# Patient Record
Sex: Female | Born: 1965 | Race: White | Hispanic: No | Marital: Married | Smoking: Never smoker
Health system: Southern US, Community
[De-identification: ages and names within clinical notes are randomized; demographics above are authoritative.]

## PROBLEM LIST (undated history)

## (undated) DIAGNOSIS — E785 Hyperlipidemia, unspecified: Secondary | ICD-10-CM

## (undated) DIAGNOSIS — E079 Disorder of thyroid, unspecified: Secondary | ICD-10-CM

## (undated) HISTORY — PX: REDUCTION MAMMAPLASTY: SUR839

## (undated) HISTORY — PX: OTHER SURGICAL HISTORY: SHX169

## (undated) HISTORY — PX: BREAST SURGERY: SHX581

## (undated) HISTORY — PX: HERNIA REPAIR: SHX51

## (undated) HISTORY — PX: LAPAROSCOPIC OOPHERECTOMY: SHX6507

## (undated) HISTORY — PX: ABDOMINAL HYSTERECTOMY: SHX81

## (undated) HISTORY — DX: Hyperlipidemia, unspecified: E78.5

## (undated) HISTORY — DX: Disorder of thyroid, unspecified: E07.9

---

## 2001-04-08 ENCOUNTER — Other Ambulatory Visit: Admission: RE | Admit: 2001-04-08 | Discharge: 2001-04-08 | Payer: Self-pay | Admitting: Family Medicine

## 2001-04-18 ENCOUNTER — Encounter: Admission: RE | Admit: 2001-04-18 | Discharge: 2001-04-18 | Payer: Self-pay | Admitting: Family Medicine

## 2001-04-18 ENCOUNTER — Encounter: Payer: Self-pay | Admitting: Family Medicine

## 2002-05-27 ENCOUNTER — Other Ambulatory Visit: Admission: RE | Admit: 2002-05-27 | Discharge: 2002-05-27 | Payer: Self-pay | Admitting: Family Medicine

## 2004-04-06 ENCOUNTER — Other Ambulatory Visit: Admission: RE | Admit: 2004-04-06 | Discharge: 2004-04-06 | Payer: Self-pay | Admitting: Obstetrics and Gynecology

## 2004-05-02 ENCOUNTER — Encounter (INDEPENDENT_AMBULATORY_CARE_PROVIDER_SITE_OTHER): Payer: Self-pay | Admitting: *Deleted

## 2004-05-02 ENCOUNTER — Inpatient Hospital Stay (HOSPITAL_COMMUNITY): Admission: RE | Admit: 2004-05-02 | Discharge: 2004-05-05 | Payer: Self-pay | Admitting: Obstetrics and Gynecology

## 2007-11-18 ENCOUNTER — Ambulatory Visit (HOSPITAL_COMMUNITY): Admission: RE | Admit: 2007-11-18 | Discharge: 2007-11-18 | Payer: Self-pay | Admitting: General Surgery

## 2007-11-28 ENCOUNTER — Encounter: Admission: RE | Admit: 2007-11-28 | Discharge: 2007-11-28 | Payer: Self-pay | Admitting: General Surgery

## 2008-03-09 ENCOUNTER — Encounter: Admission: RE | Admit: 2008-03-09 | Discharge: 2008-03-09 | Payer: Self-pay | Admitting: General Surgery

## 2008-03-23 ENCOUNTER — Ambulatory Visit (HOSPITAL_COMMUNITY): Admission: RE | Admit: 2008-03-23 | Discharge: 2008-03-24 | Payer: Self-pay | Admitting: General Surgery

## 2008-12-21 ENCOUNTER — Emergency Department (HOSPITAL_COMMUNITY): Admission: EM | Admit: 2008-12-21 | Discharge: 2008-12-21 | Payer: Self-pay | Admitting: Family Medicine

## 2010-01-02 ENCOUNTER — Encounter: Admission: RE | Admit: 2010-01-02 | Discharge: 2010-01-02 | Payer: Self-pay | Admitting: Family Medicine

## 2010-04-05 ENCOUNTER — Encounter: Admission: RE | Admit: 2010-04-05 | Discharge: 2010-04-05 | Payer: Self-pay | Admitting: General Surgery

## 2011-03-06 NOTE — Op Note (Signed)
NAME:  Connie [name], Connie [name]                  ACCOUNT NO.:  [id number]   MEDICAL RECORD NO.:  [id number]          PATIENT TYPE:  AMB   LOCATION:  DAY                          FACILITY:  [facility]   PHYSICIAN:  Sharlet Salina T. Hoxworth, M.D.DATE OF BIRTH:  December 30, 1965   DATE OF PROCEDURE:  03/23/2008  DATE OF DISCHARGE:                               OPERATIVE REPORT   PREOPERATIVE DIAGNOSES:  1. Morbid obesity.  2. Hiatal hernia.  3. Ventral incisional hernia.   POSTOPERATIVE DIAGNOSES:  1. Morbid obesity.  2. Hiatal hernia.  3. Ventral incisional hernia.   SURGICAL PROCEDURES:  1. Laparoscopic adjustable gastric band.  2. Hiatal hernia repair.  3. Laparoscopic ventral hernia repair with mesh.   SURGEON:  Lorne Skeens. Hoxworth, MD   ASSISTANT:  Thornton Park. Daphine Deutscher, MD   ANESTHESIA:  General.   BRIEF HISTORY:  Connie [name] is a 45 year old female with a long history  of progressive morbid obesity unresponsive to medical management.  After  an extensive preop discussion and workup detailed elsewhere, we have  elected proceed with placement of a laparoscopic adjustable gastric band  for treatment of her morbid obesity.  In addition, she has had a lower  abdominal wall reconstruction and hysterectomy and has a periumbilical  very symptomatic incisional hernia several centimeters in diameter.  We  plan to fix this at the same setting.  The patient has also been noted  to have a small hiatal hernia on her upper GI series.  We have discussed  that this will be repaired as well so that it does not interfere with  her lap band function.   DESCRIPTION OF OPERATION:  The patient was brought to the operating room  and placed in the supine position on the operating table and general  orotracheal anesthesia was induced.  The abdomen was widely sterilely  prepped and draped.  She received preoperative IV antibiotics.  Heparin  5000 units had been administered subcutaneously preoperatively.  The  abdomen was  widely sterilely prepped and draped.  Correct patient and  procedures were verified.  Access was obtained in the left subcostal  space with an 11-mm OptiView trocar without difficulty and  pneumoperitoneum established.  The stomach was quite distended but this  was easily decompressed with an orogastric tube.  Under direct vision a  15-mm trocar was placed in the right upper lateral abdomen just under  the falciform ligament and an 11-mm trocar in the right midabdomen,  staying as much away from her periumbilical incisional hernia as  possible.  Under direct vision a 5-mm trocar was placed subxiphoid and  then replaced with the [facility] retractor, the left lobe of the liver  elevated with excellent exposure of the hiatus of the stomach.  An 11-mm  camera was placed to the left of the umbilicus for the camera port and  finally a 5-mm trocar placed in the left flank.  The stomach and hiatus  were exposed.  There did appear to be a small hiatal hernia.  This was  confirmed by inserting the sizing tube, inflating the balloon to 15  mL  and this pulled back through the hiatal opening.  The balloon was  deflated and the sizing tube brought back into the upper esophagus.  Initially peritoneum overlying the left crus was incised with the hook  cautery and dissection carried bluntly down along the left crus toward  the retrogastric space, leaving lateral attachments on the angle of His.  Following this the gastrocolic omentum was then opened in an avascular  space and this dissection was carried up over the hiatus in preparation  for the hiatal hernia repair.  The anterior border of the right crus was  dissected just posterior to the esophagus and the retroesophageal space  entered.  There was a definite hiatal hernia and the esophageal fat pad  was brought down from the hiatal hernia and completely reduced and  excised with cautery.  The right and left crura were carefully bluntly  dissected and  clearly defined down to their base.  Following this the  hiatus was closed beginning at the base of the crura with two  interrupted 0 Ethibond sutures, closing this back to a normal size.  At  this point the peritoneum anterior to the right crus was incised more  inferiorly at the area of crossing fat and the finger dissector was able  to be passed with careful blunt dissection easily, retrogastrically  deployed up to the previously-dissected area of the angle of His.  A  flushed AP Standard lap band system was introduced, the tubing passed  with the finger dissector and then brought back behind the stomach and  the band brought back around the stomach without difficulty.  The sizing  tube was reintroduced through the stomach and then the band was locked  into place without any undue tension and the sizing tube removed.  Holding the tubing toward the patient's feet, the small gastric pouch  was defined and the fundus was imbricated up over the band to the small  gastric pouch with three interrupted 2-0 Ethibond sutures.  The band was  in very good position.  There was no bleeding.  Attention was then  turned to the ventral hernia.  The [facility] liver retractor was placed  and the patient was placed back in a neutral position.  The hernia  defect was further defined taking down preperitoneal fat and the most  inferior part of the falciform ligament to clear the anterior abdominal  wall that was about a 2-3-cm fascial defect.  A 10 cm diameter piece of  Proceed dual-sided mesh was fashioned and four stay sutures placed at  12, 3, 6 and 9 o'clock.  The mesh was then introduced into the abdominal  cavity and unfurled.  Two additional 5-mm trocars were placed in the  right lower quadrant and right lateral abdomen for the hernia repair and  inflating the mesh.  The mesh was unfurled and oriented.  Four small  stab wounds had been placed in the anterior abdominal wall corresponding  to the  sutures of previously and the suture grasper was used through  these to retrieve the sutures and the mesh was brought to the anterior  abdominal wall with excellent wide deployment and coverage of the defect  3-4 cm at least in all directions.  These sutures were secured and then  the mesh was tacked circumferentially with some additional central tacks  placed as well.  Following this abdomen was carefully inspected for any  evidence of bleeding or trocar injury and none was seen.  The  tubing was  brought out through the right mid abdominal port, all CO2 evacuated and  trocars removed.  The incision was lengthened somewhat, the anterior  fascia exposed and four 2-0 Prolene stay sutures were placed.  The  tubing was cut and attached to the port, which was then sutured to the  anterior abdominal wall with the previously-placed sutures.  This  incision was closed with subcutaneous running 3-0 Vicryl and skin  incisions were closed with subcuticular 4-0 Monocryl and Dermabond.  Sponge, needle and instrument counts correct.  The patient was taken to  the recovery room in good condition.      Lorne Skeens. Hoxworth, M.D.  Electronically Signed     BTH/MEDQ  D:  03/23/2008  T:  03/23/2008  Job:  [id number]

## 2011-03-09 ENCOUNTER — Encounter (INDEPENDENT_AMBULATORY_CARE_PROVIDER_SITE_OTHER): Payer: Self-pay | Admitting: General Surgery

## 2011-03-09 NOTE — H&P (Signed)
NAME:  Ashbaugh, Morine D                            ACCOUNT NO.:  [id number]   MEDICAL RECORD NO.:  [id number]                   PATIENT TYPE:  INP   LOCATION:  NA                                   FACILITY:  [facility]   PHYSICIAN:  Hal Morales, M.D.             DATE OF BIRTH:  1966/03/01   DATE OF ADMISSION:  DATE OF DISCHARGE:                                HISTORY & PHYSICAL   HISTORY OF PRESENT ILLNESS:  Ms. Lhommedieu is a 45 year old married white  female, status post bilateral tubal ligation, para 2-0-0-2, who presents for  a total abdominal hysterectomy because of abnormal uterine bleeding.  For  the past two years the patient's menstrual periods have increased in  duration from  three to seven days with lighter bleeding occurring during  ovulation.  The patient's flow is described as heavy with clots requiring  a change of a maxi pad along with a Super Plus tampon every 1 to 1-1/2  hours.  The spotting the patient experiences during ovulation lasts for five  days and requires a change of a pad every four hours.  Occasionally the  patient experiences cramping which she rates as 5/10 on a 10 point scale;  however, this is quickly relieved with over the counter analgesia.  In  January of 2005 a pelvic ultrasound showed a large lobular uterus, measuring  12.8 x 5.2 x 6.1 cm with an anterior fundal fibroid measuring 3 cm x 2.7 cm  and normal-appearing ovaries bilaterally.  An endometrial biopsy in June of  2005 showed benign secretory endometrium with no hyperplasia or malignancy.  Additional tests and evaluation in March and April of 2005 revealed a normal  CA-125 and an endocrine consult which confirmed that the patient's  hypothyroid condition was responding appropriately to her 88 mcg dosing of  Synthroid.  A review of management options for fibroids along with the risks  and benefits of each included:  Birth control pills (the patient previously  had poor results from this),  Depo-Provera (the patient had concerns about  weight gain), Lupron Depo, uterine artery embolization, myomectomy, and  hysterectomy.  After careful consideration of each of these options, the  patient has chosen definitive therapy in the form of hysterectomy.   PAST MEDICAL HISTORY/OB HISTORY:  Gravida 2, para 2-0-0-2; the patient had a  cesarean section in 86 and 1992.  She experienced hyperemesis during her  pregnancies.   GYN HISTORY:  Menarche, 45 years old.  Last menstrual period was April 08, 2004.  She uses bilateral tubal ligation as her method of contraception,  denies any history of sexually transmitted diseases or abnormal Pap smears.  Her last normal Pap smear was June 2005.   PAST MEDICAL HISTORY:  1. Hypothyroidism.  2. Attention deficit disorder.  3. Insulin resistant.   PAST SURGICAL HISTORY:  1. 1987, cesarean section.  2. 1992, cesarean section.  3. 1993, abdominoplasty.  4. 1994, breast augmentation, denies any problems with anesthesia or history     of blood transfusions.   FAMILY HISTORY:  Positive for ovarian cancer, lung cancer, and diabetes  mellitus.   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   HABITS:  The patient does not use tobacco.  She rarely consumes alcohol.   CURRENT MEDICATIONS:  1. Adderall 10 mg three times a day.  2. Synthroid 88 mcg one tablet daily.  3. Fiber tablets two tablets daily.   ALLERGIES:  1. The patient is allergic to 'PORK which causes her severe gastrointestinal     upset and causes her to become comatose.  2. TETANUS causes tissue necrosis.  3. DEMEROL - violent vomiting.   REVIEW OF SYSTEMS:  Positive for bloating, increased abdominal girth.  The  patient does wear glasses.  Urge/stress incontinence.   PHYSICAL EXAMINATION:  VITAL SIGNS:  Blood pressure 120/80, weight 193,  height 5 feet 0 inches tall.  NECK:  Supple.  There is no thyromegaly or cervical adenopathy.  HEART:   Regular rate and rhythm.  There is no murmur.  LUNGS:  Clear.  There are no wheezes, rales, or rhonchi.  BACK:  No CVA tenderness.  ABDOMEN:  Bowel sounds are present.  It is soft without tenderness,  guarding, rebound, or organomegaly.  EXTREMITIES:  No clubbing, cyanosis, or edema.  PELVIC:  EGBUS is within normal limits.  Vagina is rugous.  Cervix is  nontender without lesions.  Uterus appears 12-14 weeks' size, without  tenderness.  Adnexa without tenderness or masses.  Rectovaginal without  tenderness or masses.   IMPRESSION:  1. Menometrorrhagia.  2. Fibroid uterus.  3. Urge/stress incontinence.   DISPOSITION:  A discussion was held with the patient regarding the  indications for her procedure (as mentioned in history of present illness.  Other options were also discussed in detail), along with the risks which  include, but are not limited to, reaction to anesthesia, damage to adjacent  organs, infection, and excessive bleeding.  The patient further understands  that she will no longer be able bear children after this procedure.  At the  time of this dictation the patient is undergoing urologic evaluation for her  symptoms of urge/stress incontinence and those results are pending.  She was  further given a copy of the ACOG brochure entitled Understanding  Hysterectomy and Preparing for Surgery.  The patient is scheduled to  undergo a total abdominal hysterectomy at [facility] on  May 02, 2004 at 7:30 a.m.     Elmira J. Adline Peals.                    Hal Morales, M.D.    EJP/MEDQ  D:  04/29/2004  T:  04/29/2004  Job:  [id number]

## 2011-03-09 NOTE — Op Note (Signed)
NAME:  Connie [name], Connie [name]                            ACCOUNT NO.:  [id number]   MEDICAL RECORD NO.:  [id number]                   PATIENT TYPE:  INP   LOCATION:  9399                                 FACILITY:  [facility]   PHYSICIAN:  Hal Morales, M.[name].             DATE OF BIRTH:  12/15/1965   DATE OF PROCEDURE:  05/02/2004  DATE OF DISCHARGE:                                 OPERATIVE REPORT   PREOPERATIVE DIAGNOSES:  1. Menometrorrhagia.  2. Uterine fibroids.  3. Stress urinary incontinence.  4. Urgency incontinence.  5. Pigmented nevus of the abdomen.   POSTOPERATIVE DIAGNOSES:  1. Menometrorrhagia.  2. Uterine fibroids.  3. Stress urinary incontinence.  4. Urgency incontinence.  5. Pigmented nevus of the abdomen.   1. Peritoneal adhesive disease.   OPERATION:  Transobturator tension free vaginal tape sling, total abdominal  hysterectomy, lysis of adhesions, removal of abdominal nevus.   ANESTHESIA:  Spinal and epidural.   ESTIMATED BLOOD LOSS:  300 mL.   SURGEONS:  1. Sigmund I. Patsi Sears, M.[name]., for TVT.  2. Hal Morales, M.[name]., for total abdominal hysterectomy, lysis of     adhesions and removal of nevus.   FIRST ASSISTANTS:  1. Hal Morales, M.[name]., for TVT.  2. Osborn Coho, M.[name]., for total abdominal hysterectomy and lysis of     adhesions.   ESTIMATED BLOOD LOSS:  300 mL.   COMPLICATIONS:  None.   FINDINGS:  The uterus was upper limits of normal size and densely adherent  to the anterior abdominal wall.  The ovaries were normal bilaterally.  The  tubes were status post tubal interruption for sterilization.  There was a 1  x 1.5 cm pigmented nevus in the left lower quadrant near the left groin.   DESCRIPTION OF PROCEDURE:  The patient was taken to the operating room after  appropriate identification, placed on the operating room.  After the  attainment of adequate general anesthesia, she was placed in the lithotomy  position.  Sigmund I. Patsi Sears,  M.[name]., performed the transobturator TVT and  will dictate that under a separate operative report.  Once that had been  completed, the patient was placed in the supine position and the abdomen  prepped with  multiple layers of Betadine and draped as a sterile field.  A  Foley catheter had been left in place after the TVT and the vagina had been  prepped as part of the preparation for that procedure.  Subcutaneous  infiltration of 0.25% Marcaine underneath the pigmented nevus and along the  impended incision line was undertaken for a total of 20 mL.  The pigmented  nevus was sharply excised and the subcutaneous bleeders cauterized.  The  skin incision was closed with a subcuticular suture of 3-0 Monocryl and  Steri-Strips applied.  The suprapubic incision was made approximately 2  fingerbreadths above the symphysis pubis and the abdomen opened in layers.  The peritoneum was entered and the uterus noted to be densely adherent to  the anterior abdominal peritoneum.  A combination of sharp and blunt  dissection allowed the uterus to be freed from the anterior peritoneum and  once this had occurred, the self-retaining retractor could be placed.  The  uterus was then elevated into the operative field with Kelly clamps at  either fundal region.  The left round ligament was identified, suture  ligated and incised and that incision taken anteriorly on the anterior leaf  of the broad ligament.  The utero-ovarian ligament was isolated, clamped,  cut and tied with free tie and suture ligated.  A similar procedure was  carried out on the opposite side with the round ligament and the utero-  ovarian ligament.  The bladder was sharply and bluntly dissected off the  anterior cervix with a portion of this dissection  having been done with the  uterus was separated from the anterior peritoneum.  The uterine arteries on  the right and left side were skeletonized, clamped, cut, and suture ligated.  The  paracervical tissues were successively clamped, cut, and suture ligated  down to the level of the uterosacral ligaments on the right and left side.  These ligaments were then clamped, cut, and suture ligated and their sutures  held.  The vaginal angles were then clamped, cut, and suture ligated with  those sutures held and with complete closure of the vaginal cuff within  those two sutures.  Hemostatic sutures were then placed on the right and  left side between the uterosacral ligament sutures and the vaginal sutures  to allow for adequate hemostasis.  An additional hemostatic suture was  placed in the area of the right ovary to allow for good hemostasis in that  area.  Copious irrigation was carried out and the sutures holding the  vaginal angle and the uterosacral ligaments tied together on either side.  Once copious irrigation was again carried out and hemostasis noted to be  adequate, all instruments were removed from the peritoneal cavity.  The  abdominal peritoneum was then repaired with a running suture of 2-0 Vicryl.  The rectus fascia was closed with a running suture of 0 Vicryl from each  apex to the midline and tied in the midline.  The subcutaneous tissue was  copiously irrigated and made hemostatic with Bovie cautery.  A 7 mm Al Pimple drain was placed in the subcutaneous tissue through a stab wound in  the right lower quadrant.  This was tied in place with a suture of 2-0  Ethilon.  Skin staples were applied to the skin incision and sterile  dressing applied.  The grenade was attached to the Jackson-Pratt drain and a  sterile dressing applied to that as well.  The patient was awakened from  general anesthesia and taken to the recovery room in satisfactory condition  having tolerated the procedure well with sponge and instrument counts  correct.                                               Hal Morales, M.[name].   VPH/MEDQ  [name]:  05/02/2004  T:  05/02/2004  Job:   [id number]   cc:   Lynelle Smoke I. Patsi Sears, M.[name].  [address]  [place]  [place] [address]  Fax: [phone]

## 2011-03-09 NOTE — Discharge Summary (Signed)
NAME:  Connie [name], Connie [name]                            ACCOUNT NO.:  [id number]   MEDICAL RECORD NO.:  [id number]                   PATIENT TYPE:  INP   LOCATION:  9315                                 FACILITY:  [facility]   PHYSICIAN:  Connie [name], M.[name].             DATE OF BIRTH:  [birth year]   DATE OF ADMISSION:  05/02/2004  DATE OF DISCHARGE:  05/05/2004                                 DISCHARGE SUMMARY   DISCHARGE DIAGNOSES:  1. Fibroid uterus.  2. Menometrorrhagia.  3. Stress urinary incontinence.  4. Urge incontinence.  5. Adenomyosis.  6. Pelvic adhesions.  7. Endometrial polyp.  8. Seborrheic keratosis.   OPERATION:  On the date of admission the patient underwent a total abdominal  hysterectomy with lysis of adhesions, excision of left lower quadrant nevus,  and the placement of a transobturator transvaginal tape, tolerating all  procedures well.  The patient was found to have a uterus which was the upper  limits of normal size and adherent to the anterior abdominal wall with  normal-appearing ovaries bilaterally.  She was also observed to have a  pigmented nevus in her left lower quadrant measuring 1.0 x 1.5 cm.   HISTORY OF PRESENT ILLNESS:  Connie [name] is a 45 year old married white  female status post bilateral tubal ligation para 2-0-0-2 who presents for a  total abdominal hysterectomy because of abnormal uterine bleeding and for  excision of a left lower quadrant pigmented nevus.  During the patient's  preoperative examination it was found that she was experiencing symptoms of  stress and urge incontinence, underwent urologic evaluation, and was  consented to proceed with the placement of a transvaginal tape.  Please see  the patient's dictated History and Physical Examination for details.   PREOPERATIVE PHYSICAL EXAMINATION:  VITAL SIGNS:  Blood pressure 120/80,  weight is 193 pounds, height 5 feet 0 inches tall.  GENERAL:  Within normal limits.  PELVIC:  EG/BUS is  within normal limits.  The vagina is rugous.  Cervix is  nontender without lesions.  Uterus appears 12-14 weeks size without  tenderness.  Adnexa without tenderness or masses.  Rectovaginal without  tenderness or masses.   HOSPITAL COURSE:  On the date of admission the patient underwent  aforementioned procedures, tolerating them all well.  Postoperative course  was marked by a temperature spike of 102 degrees Fahrenheit orally on the  eve of the patient's day of surgery.  Her white blood count was found to be  within normal limits and the patient's fever responded appropriately to  antipyretics.  Postoperative hemoglobin was 9.9 (preoperative hemoglobin  12.5).  The patient continued to have fluctuations in her temperature  throughout her hospital stay.  However, blood cultures proved to be negative  and an abdominopelvic CT scan returned without any acute process being  identified.  By postoperative day #3 the patient had resumed bowel and  bladder function  and was therefore deemed ready for discharge home.  At the  time of the patient's discharge it was observed that she appeared to have  cellulitis around the site of her nevus excision and was therefore placed on  antibiotics.   DISCHARGE MEDICATIONS:  1. Augmentin 500 mg one tablet three times daily for 7 days.  2. Phenergan 25 mg one tablet q.6h. as needed for nausea.  3. Protonix 20 mg one tablet daily.  4. Tylox one to two tablets q.4-6h. as needed for pain.  5. Ibuprofen 600 mg one tablet q.6h. for 3 days then as needed for pain.   FOLLOW-UP:  The patient is scheduled for staple removal along with removal  of her JP drain at [facility] on May 09, 2004 at 2:30 p.m.  At  that time she will be scheduled for a 6 weeks postoperative visit with Dr.  Pennie [name].   DISCHARGE INSTRUCTIONS:  The patient was given a copy of [facility] postoperative instruction sheet.  She was further advised to call the   physician for any increase in pain, increased bleeding, temperature greater  than 100.4, and to maintain a clean and dry incision.  The patient's diet  was without restriction.   FINAL PATHOLOGY:  1. Skin excisional biopsy:  Seborrheic keratosis.  2. Uterus and cervix, hysterectomy:  Cervix:  Cervicitis and squamous     metaplasia.  Endometrium:  Proliferative endometrium, endometrial polyp     with focal complex hyperplasia without atypia, no carcinoma identified.     Myometrium:  Adenomyosis.  Serosa:  Adhesions and endosalpingiosis.     Connie [name].                    Connie [name], M.[name].    EJP/MEDQ  [name]:  05/30/2004  T:  05/30/2004  Job:  [id number]

## 2011-03-09 NOTE — Op Note (Signed)
NAME:  [name], Connie D                            ACCOUNT NO.:  [id number]   MEDICAL RECORD NO.:  [id number]                   PATIENT TYPE:  INP   LOCATION:  9399                                 FACILITY:  [facility]   PHYSICIAN:  Sigmund I. Patsi Sears, M.D.         DATE OF BIRTH:  [birth year]   DATE OF PROCEDURE:  05/02/2004  DATE OF DISCHARGE:                                 OPERATIVE REPORT   PREOPERATIVE DIAGNOSIS:  Stress urinary incontinence.   POSTOPERATIVE DIAGNOSIS:  Stress urinary incontinence.   OPERATION:  Transobturator pubovaginal sling (Mentor OB tape).   PREPARATION:  After appropriate preanesthesia, the patient is brought to the  operating room and placed on the operating table in dorsal supine position  where a spinal anesthetic was placed.  She was then replaced in the dorsal  lithotomy position where the pubis is prepped with Betadine solution and  draped in usual fashion.   DESCRIPTION OF PROCEDURE:  A Foley catheter was placed, and posterior  weighted speculum was placed.  There were 8 mL of Marcaine 0.25 plain  injected into the periurethral space, and a 2 cm midline incision is made  and subcutaneous tissue is dissected lateralward to the retropubic space.  Finger dissection and blunt dissection was also accomplished.  Two separate  stab wounds were made 5 cm lateral to the clitoris, and following stab  wound, the flap Mentor transobturator instrument is placed, and tape placed  in standard fashion.  Midline is identified, and the smooth flat portion of  the sling was placed where the midline mark is noted in correct position.  Cystoscopy revealed no evidence of any bladder trauma.  The bladder was  drained of fluid.  Foley catheter placed, and with a right angle clamp  behind the sling for tensioning, the transobturator tape was cut at the  level of the insertion sites.  Right angle was removed, and the urethral  wound closed in two layers with 3-0 Vicryl suture.   The patient tolerated  the procedure well.  Foley catheter was replaced, the patient underwent  hysterectomy per Dr. Pennie Rushing.                                               Sigmund I. Patsi Sears, M.D.    SIT/MEDQ  D:  05/02/2004  T:  05/02/2004  Job:  [id number]

## 2011-07-19 LAB — DIFFERENTIAL
Basophils Relative: 0
Eosinophils Absolute: 0
Lymphs Abs: 1.3
Monocytes Relative: 6
Neutro Abs: 12.1 — ABNORMAL HIGH
Neutrophils Relative %: 85 — ABNORMAL HIGH

## 2011-07-19 LAB — HEMOGLOBIN AND HEMATOCRIT, BLOOD: Hemoglobin: 13.3

## 2011-07-19 LAB — CBC
MCHC: 34.4
MCV: 89.4
Platelets: 194
RBC: 4.03
WBC: 14.2 — ABNORMAL HIGH

## 2011-12-17 ENCOUNTER — Emergency Department (HOSPITAL_COMMUNITY)
Admission: EM | Admit: 2011-12-17 | Discharge: 2011-12-18 | Disposition: A | Discharge status: Home / Self Care | Payer: 59 | Attending: Emergency Medicine | Admitting: Emergency Medicine

## 2011-12-17 ENCOUNTER — Other Ambulatory Visit: Payer: Self-pay

## 2011-12-17 ENCOUNTER — Encounter (HOSPITAL_COMMUNITY): Payer: Self-pay | Admitting: *Deleted

## 2011-12-17 DIAGNOSIS — E785 Hyperlipidemia, unspecified: Secondary | ICD-10-CM | POA: Insufficient documentation

## 2011-12-17 DIAGNOSIS — E079 Disorder of thyroid, unspecified: Secondary | ICD-10-CM | POA: Insufficient documentation

## 2011-12-17 DIAGNOSIS — M542 Cervicalgia: Secondary | ICD-10-CM

## 2011-12-17 DIAGNOSIS — Z79899 Other long term (current) drug therapy: Secondary | ICD-10-CM | POA: Insufficient documentation

## 2011-12-17 LAB — COMPREHENSIVE METABOLIC PANEL
ALT: 12 U/L (ref 0–35)
AST: 11 U/L (ref 0–37)
Albumin: 4.2 g/dL (ref 3.5–5.2)
Alkaline Phosphatase: 38 U/L — ABNORMAL LOW (ref 39–117)
Chloride: 107 mEq/L (ref 96–112)
Potassium: 4 mEq/L (ref 3.5–5.1)
Sodium: 139 mEq/L (ref 135–145)
Total Protein: 7.1 g/dL (ref 6.0–8.3)

## 2011-12-17 LAB — POCT I-STAT TROPONIN I: Troponin i, poc: 0 ng/mL (ref 0.00–0.08)

## 2011-12-17 LAB — CK TOTAL AND CKMB (NOT AT ARMC)
CK, MB: 1.6 ng/mL (ref 0.3–4.0)
Relative Index: INVALID (ref 0.0–2.5)
Total CK: 68 U/L (ref 7–177)

## 2011-12-17 LAB — DIFFERENTIAL
Lymphocytes Relative: 32 % (ref 12–46)
Lymphs Abs: 3.3 10*3/uL (ref 0.7–4.0)
Monocytes Relative: 6 % (ref 3–12)
Neutrophils Relative %: 60 % (ref 43–77)

## 2011-12-17 LAB — CBC
Hemoglobin: 12.3 g/dL (ref 12.0–15.0)
Platelets: 263 10*3/uL (ref 150–400)
RBC: 4.07 MIL/uL (ref 3.87–5.11)
WBC: 10.4 10*3/uL (ref 4.0–10.5)

## 2011-12-17 MED ORDER — OXYCODONE-ACETAMINOPHEN 5-325 MG PO TABS
1.0000 | ORAL_TABLET | Freq: Once | ORAL | Status: AC
  Administered 2011-12-17: 1 via ORAL
  Filled 2011-12-17: qty 1

## 2011-12-17 NOTE — ED Notes (Signed)
The pt has mild pain only at present. More pain with movement of her head to the rt side.  She took 324mg  of aspirin at approx 1800

## 2011-12-17 NOTE — ED Notes (Signed)
Pt states 7/10 intermittent L sided neck pain, pain becomes worse with movement. Pt denies recent injury to area. States pain radiates down to middle back. She is conscious alert and oriented x4. No signs of distress noted at the time. Husband at bedside.

## 2011-12-17 NOTE — ED Provider Notes (Signed)
Pt seen with NP Manus Rudd She is here for neck pain, seems worse with movement I doubt ACS after talking to patient She is well appearing, Neck pain worse with movement No focal neuro deficits noted EKG reviewed  Joya Gaskins, MD 12/17/11 2318
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
## 2011-12-17 NOTE — ED Notes (Signed)
The pt is c/o neck pain since earlier today with lt lateral neck pain that radiates down to the middle of her back with sl nausea.

## 2011-12-18 MED ORDER — OXYCODONE-ACETAMINOPHEN 5-325 MG PO TABS: 1.0000 | ORAL_TABLET | Freq: Four times a day (QID) | ORAL | Status: AC | PRN

## 2011-12-18 NOTE — Discharge Instructions (Signed)
The exact cause of your neck pain tonight has not been definitively agonist, your EKG, cardiac markers electrolytes, CBC are all normal even given a prescription for Percocet for pain control.  Please make an appointment with your primary care physician for followup if he develops shortness of breath, diaphoresis, continued, persistent nausea, dizziness, chest pain please return for further evaluation

## 2011-12-18 NOTE — ED Notes (Signed)
Pt discharged home. Had no further questions. Encouraged to follow up with PCP. 

## 2011-12-19 NOTE — ED Provider Notes (Signed)
Medical screening examination/treatment/procedure(s) were conducted as a shared visit with non-physician practitioner(s) and myself.  I personally evaluated the patient during the encounter  Pt seen/examined, appears to be musculoskeletal in nature Doubt ACS at this time   Joya Gaskins, MD 12/19/11 [phone]

## 2011-12-25 ENCOUNTER — Other Ambulatory Visit (HOSPITAL_COMMUNITY): Payer: Self-pay | Admitting: Physician Assistant

## 2011-12-25 DIAGNOSIS — M542 Cervicalgia: Secondary | ICD-10-CM

## 2011-12-27 ENCOUNTER — Other Ambulatory Visit (HOSPITAL_COMMUNITY): Payer: 59

## 2011-12-31 ENCOUNTER — Ambulatory Visit (HOSPITAL_COMMUNITY)
Admission: RE | Admit: 2011-12-31 | Discharge: 2011-12-31 | Disposition: A | Discharge status: Home / Self Care | Payer: 59 | Source: Ambulatory Visit | Attending: Physician Assistant | Admitting: Physician Assistant

## 2011-12-31 DIAGNOSIS — R220 Localized swelling, mass and lump, head: Secondary | ICD-10-CM

## 2011-12-31 DIAGNOSIS — R221 Localized swelling, mass and lump, neck: Secondary | ICD-10-CM

## 2011-12-31 DIAGNOSIS — M542 Cervicalgia: Secondary | ICD-10-CM

## 2011-12-31 DIAGNOSIS — I6529 Occlusion and stenosis of unspecified carotid artery: Secondary | ICD-10-CM | POA: Insufficient documentation

## 2011-12-31 NOTE — Progress Notes (Signed)
*  PRELIMINARY RESULTS* Vascular Ultrasound Carotid Duplex (Doppler) has been completed.  Preliminary findings: Bilaterally no significant ICA stenosis with antegrade vertebral flow.  Farrel Demark RDMS 12/31/2011, 10:37 AM

## 2012-10-27 ENCOUNTER — Telehealth (INDEPENDENT_AMBULATORY_CARE_PROVIDER_SITE_OTHER): Payer: Self-pay | Admitting: General Surgery

## 2012-10-27 NOTE — Telephone Encounter (Signed)
10/27/12 mailed bariatric surgery recall letter to patient. Advised patient to call [phone] to schedule an appointment for follow-up. (lss)

## 2013-01-05 ENCOUNTER — Telehealth (INDEPENDENT_AMBULATORY_CARE_PROVIDER_SITE_OTHER): Payer: Self-pay | Admitting: *Deleted

## 2013-01-05 NOTE — Telephone Encounter (Signed)
Patient called in to state that she has been having some "gurgling" following eating.  Patient also states that for about the last 3 weeks she is becoming nauseated and also having some vomiting.  Patient states she is unable to come in to see Mardelle Matte PA this week due to her daughters wedding but is scheduled for lap band clinic 01/15/13.  Encouraged patient to come in earlier but states she can't due to daughters wedding.

## 2013-01-15 ENCOUNTER — Ambulatory Visit (INDEPENDENT_AMBULATORY_CARE_PROVIDER_SITE_OTHER): Payer: 59 | Admitting: Physician Assistant

## 2013-01-15 ENCOUNTER — Encounter (INDEPENDENT_AMBULATORY_CARE_PROVIDER_SITE_OTHER): Payer: Self-pay

## 2013-01-15 VITALS — BP 126/80 | HR 72 | Temp 97.4°F | Resp 16 | Ht 60.0 in | Wt 171.0 lb

## 2013-01-15 DIAGNOSIS — Z4651 Encounter for fitting and adjustment of gastric lap band: Secondary | ICD-10-CM

## 2013-01-15 NOTE — Progress Notes (Signed)
  HISTORY: Connie [name] is a 47 y.o.female who received an AP-Standard lap-band in June 2009 by Dr. Johna Sheriff. She arrives complaining of 6-8 weeks of increasing feeling of an 'air bubble' in her stomach which is associated with difficulty swallowing and regurgitation. She tends to bring up clear frothy liquid as a result. She's also complaining of a bulging to her left flank which she says has been getting progressively larger. She doesn't complain of pain there however. She says co-workers and her husband have both noticed this. She denies difficulty with passing flatus or BM.  VITAL SIGNS: Filed Vitals:   01/15/13 1134  BP: 126/80  Pulse: 72  Temp: 97.4 F (36.3 C)  Resp: 16    PHYSICAL EXAM: Physical exam reveals a very well-appearing 47 y.o.female in no apparent distress Neurologic: Awake, alert, oriented Psych: Bright affect, conversant Respiratory: Breathing even and unlabored. No stridor or wheezing Abdomen: Soft, nontender, nondistended to palpation. Incisions well-healed. No incisional hernias. Port easily palpated. The area that she indicates to her left flank is devoid of obvious hernia. No tenderness. The area is soft to palpation. There are no incisions in the area. Extremities: Atraumatic, good range of motion.  ASSESMENT: 47 y.o.  female  s/p AP-Standard lap-band.   PLAN: The patient's port was accessed with a 20G Huber needle without difficulty. Clear fluid was aspirated and 0.5 mL saline was removed from the port to give a total predicted volume of 5 mL. The patient was able to swallow water without difficulty following the procedure and was instructed to take clear liquids for the next 24-48 hours and advance slowly as tolerated. If the upper GI doesn't give any clues about her left flank bulge, I'll likely order a CT scan.

## 2013-01-15 NOTE — Patient Instructions (Signed)
Obtain your x-ray. Return afterward. Return sooner if you still have issues swallowing despite having fluid removed today.

## 2013-01-26 ENCOUNTER — Other Ambulatory Visit: Payer: 59

## 2013-01-29 ENCOUNTER — Encounter (INDEPENDENT_AMBULATORY_CARE_PROVIDER_SITE_OTHER): Payer: 59

## 2013-02-04 ENCOUNTER — Ambulatory Visit
Admission: RE | Admit: 2013-02-04 | Discharge: 2013-02-04 | Disposition: A | Discharge status: Home / Self Care | Payer: 59 | Source: Ambulatory Visit | Attending: Physician Assistant | Admitting: Physician Assistant

## 2013-02-04 DIAGNOSIS — Z4651 Encounter for fitting and adjustment of gastric lap band: Secondary | ICD-10-CM

## 2013-02-05 ENCOUNTER — Ambulatory Visit (INDEPENDENT_AMBULATORY_CARE_PROVIDER_SITE_OTHER): Payer: 59 | Admitting: Physician Assistant

## 2013-02-05 ENCOUNTER — Encounter (INDEPENDENT_AMBULATORY_CARE_PROVIDER_SITE_OTHER): Payer: Self-pay

## 2013-02-05 VITALS — BP 118/74 | HR 68 | Temp 97.4°F | Resp 12 | Ht 60.0 in | Wt 174.6 lb

## 2013-02-05 DIAGNOSIS — R1900 Intra-abdominal and pelvic swelling, mass and lump, unspecified site: Secondary | ICD-10-CM

## 2013-02-05 DIAGNOSIS — Z4651 Encounter for fitting and adjustment of gastric lap band: Secondary | ICD-10-CM

## 2013-02-05 NOTE — Patient Instructions (Signed)
Take clear liquids tonight. Thin protein shakes are ok to start tomorrow morning. Slowly advance your diet thereafter. Call us if you have persistent vomiting or regurgitation, night cough or reflux symptoms. Return as scheduled or sooner if you notice no changes in hunger/portion sizes. Obtain your CT scan.

## 2013-02-05 NOTE — Progress Notes (Signed)
  HISTORY: Connie [name] is a 47 y.o.female who received an AP-Standard lap-band in June 2009 by Dr. Johna Sheriff. She had an upper GI which showed the band in good position and contrast passed through without difficulty. She says she has no further symptoms of a 'bubble' when she swallows but she's hungry and eating more. She still has complaints of swelling to her left upper abdominal wall. She does have a history of ventral hernia repair with mesh.  VITAL SIGNS: Filed Vitals:   02/05/13 0855  BP: 118/74  Pulse: 68  Temp: 97.4 F (36.3 C)  Resp: 12    PHYSICAL EXAM: Physical exam reveals a very well-appearing 47 y.o.female in no apparent distress Neurologic: Awake, alert, oriented Psych: Bright affect, conversant Respiratory: Breathing even and unlabored. No stridor or wheezing Abdomen: Soft, nontender, nondistended to palpation. Incisions well-healed. No incisional hernias. Port easily palpated. Prominence of left upper abdominal wall with no appreciable mass or tenderness. Extremities: Atraumatic, good range of motion.  ASSESMENT: 47 y.o.  female  s/p AP-Standard lap-band.   PLAN: The patient's port was accessed with a 20G Huber needle without difficulty. Clear fluid was aspirated and 0.25 mL saline was added to the port to give a total predicted volume of 5.25 mL. The patient was able to swallow water without difficulty following the procedure and was instructed to take clear liquids for the next 24-48 hours and advance slowly as tolerated. As we discussed last time, I've ordered a CT A/P w/contrast to investigate the left upper abdominal swelling. We'll have her back in a month.

## 2013-02-12 ENCOUNTER — Other Ambulatory Visit: Payer: 59

## 2013-03-05 ENCOUNTER — Encounter (INDEPENDENT_AMBULATORY_CARE_PROVIDER_SITE_OTHER): Payer: 59

## 2015-10-11 ENCOUNTER — Other Ambulatory Visit: Payer: Self-pay | Admitting: Family Medicine

## 2015-10-11 DIAGNOSIS — R140 Abdominal distension (gaseous): Secondary | ICD-10-CM

## 2015-10-28 ENCOUNTER — Ambulatory Visit
Admission: RE | Admit: 2015-10-28 | Discharge: 2015-10-28 | Disposition: A | Discharge status: Home / Self Care | Payer: 59 | Source: Ambulatory Visit | Attending: Family Medicine | Admitting: Family Medicine

## 2015-10-28 ENCOUNTER — Ambulatory Visit
Admission: RE | Admit: 2015-10-28 | Discharge: 2015-10-28 | Disposition: A | Discharge status: Home / Self Care | Payer: Self-pay | Source: Ambulatory Visit | Attending: Family Medicine | Admitting: Family Medicine

## 2015-10-28 DIAGNOSIS — R140 Abdominal distension (gaseous): Secondary | ICD-10-CM

## 2016-04-20 DIAGNOSIS — J343 Hypertrophy of nasal turbinates: Secondary | ICD-10-CM | POA: Insufficient documentation

## 2016-04-20 DIAGNOSIS — J310 Chronic rhinitis: Secondary | ICD-10-CM | POA: Insufficient documentation

## 2016-04-20 DIAGNOSIS — J342 Deviated nasal septum: Secondary | ICD-10-CM | POA: Insufficient documentation

## 2016-04-20 DIAGNOSIS — H6983 Other specified disorders of Eustachian tube, bilateral: Secondary | ICD-10-CM | POA: Insufficient documentation

## 2016-10-17 ENCOUNTER — Encounter: Payer: Self-pay | Admitting: *Deleted

## 2016-10-24 ENCOUNTER — Encounter (HOSPITAL_COMMUNITY): Payer: Self-pay

## 2017-01-28 DIAGNOSIS — E785 Hyperlipidemia, unspecified: Secondary | ICD-10-CM | POA: Diagnosis not present

## 2017-01-28 DIAGNOSIS — Z9884 Bariatric surgery status: Secondary | ICD-10-CM | POA: Diagnosis not present

## 2017-01-28 DIAGNOSIS — E039 Hypothyroidism, unspecified: Secondary | ICD-10-CM | POA: Diagnosis not present

## 2017-01-28 DIAGNOSIS — E559 Vitamin D deficiency, unspecified: Secondary | ICD-10-CM | POA: Diagnosis not present

## 2017-03-25 DIAGNOSIS — E038 Other specified hypothyroidism: Secondary | ICD-10-CM | POA: Diagnosis not present

## 2017-04-23 DIAGNOSIS — H5203 Hypermetropia, bilateral: Secondary | ICD-10-CM | POA: Diagnosis not present

## 2017-07-30 ENCOUNTER — Other Ambulatory Visit: Payer: Self-pay | Admitting: Physician Assistant

## 2017-07-30 DIAGNOSIS — Z0000 Encounter for general adult medical examination without abnormal findings: Secondary | ICD-10-CM | POA: Diagnosis not present

## 2017-07-30 DIAGNOSIS — R140 Abdominal distension (gaseous): Secondary | ICD-10-CM

## 2017-07-30 DIAGNOSIS — R1032 Left lower quadrant pain: Secondary | ICD-10-CM

## 2017-07-30 DIAGNOSIS — E039 Hypothyroidism, unspecified: Secondary | ICD-10-CM | POA: Diagnosis not present

## 2017-07-30 DIAGNOSIS — E7800 Pure hypercholesterolemia, unspecified: Secondary | ICD-10-CM | POA: Diagnosis not present

## 2017-07-30 DIAGNOSIS — E559 Vitamin D deficiency, unspecified: Secondary | ICD-10-CM | POA: Diagnosis not present

## 2017-08-06 ENCOUNTER — Ambulatory Visit
Admission: RE | Admit: 2017-08-06 | Discharge: 2017-08-06 | Disposition: A | Discharge status: Home / Self Care | Payer: 59 | Source: Ambulatory Visit | Attending: Physician Assistant | Admitting: Physician Assistant

## 2017-08-06 DIAGNOSIS — R1032 Left lower quadrant pain: Secondary | ICD-10-CM

## 2017-08-06 DIAGNOSIS — R102 Pelvic and perineal pain: Secondary | ICD-10-CM | POA: Diagnosis not present

## 2017-08-06 DIAGNOSIS — R140 Abdominal distension (gaseous): Secondary | ICD-10-CM

## 2017-08-09 ENCOUNTER — Telehealth: Payer: Self-pay | Admitting: *Deleted

## 2017-08-09 DIAGNOSIS — Z9889 Other specified postprocedural states: Secondary | ICD-10-CM | POA: Diagnosis not present

## 2017-08-09 NOTE — Telephone Encounter (Signed)
Call[facility] and [facility] with Myrene at [facility]. Gave the information for the new patient appt on November 7th at 10:45am arrive at 10:30am. She will contact the patient.

## 2017-08-12 ENCOUNTER — Other Ambulatory Visit: Payer: Self-pay | Admitting: Physician Assistant

## 2017-08-12 DIAGNOSIS — Z1231 Encounter for screening mammogram for malignant neoplasm of breast: Secondary | ICD-10-CM

## 2017-08-13 ENCOUNTER — Telehealth: Payer: Self-pay

## 2017-08-13 NOTE — Telephone Encounter (Signed)
Spoke with patient concerning appoint date and time per orders 10/23 phone que

## 2017-08-15 ENCOUNTER — Encounter: Payer: Self-pay | Admitting: Gynecologic Oncology

## 2017-08-15 ENCOUNTER — Ambulatory Visit: Payer: 59 | Attending: Gynecologic Oncology | Admitting: Gynecologic Oncology

## 2017-08-15 VITALS — BP 118/79 | HR 66 | Temp 97.6°F | Resp 18 | Ht 60.0 in | Wt 175.1 lb

## 2017-08-15 DIAGNOSIS — E079 Disorder of thyroid, unspecified: Secondary | ICD-10-CM | POA: Diagnosis not present

## 2017-08-15 DIAGNOSIS — Z887 Allergy status to serum and vaccine status: Secondary | ICD-10-CM | POA: Insufficient documentation

## 2017-08-15 DIAGNOSIS — Z793 Long term (current) use of hormonal contraceptives: Secondary | ICD-10-CM | POA: Insufficient documentation

## 2017-08-15 DIAGNOSIS — Z885 Allergy status to narcotic agent status: Secondary | ICD-10-CM | POA: Insufficient documentation

## 2017-08-15 DIAGNOSIS — Z7982 Long term (current) use of aspirin: Secondary | ICD-10-CM | POA: Insufficient documentation

## 2017-08-15 DIAGNOSIS — N83292 Other ovarian cyst, left side: Secondary | ICD-10-CM | POA: Insufficient documentation

## 2017-08-15 DIAGNOSIS — Z91018 Allergy to other foods: Secondary | ICD-10-CM | POA: Insufficient documentation

## 2017-08-15 DIAGNOSIS — Z79899 Other long term (current) drug therapy: Secondary | ICD-10-CM | POA: Diagnosis not present

## 2017-08-15 DIAGNOSIS — E785 Hyperlipidemia, unspecified: Secondary | ICD-10-CM | POA: Diagnosis not present

## 2017-08-15 NOTE — Progress Notes (Signed)
Consult Note: Gyn-Onc  Consult was requested by Dr. Charlotta Newtonzan for the evaluation of Shamya D Rovner 51 y.o. female  CC:  Chief Complaint  Patient presents with  . Complex cyst of left ovary    Assessment/Plan:  Ms. Sherian Reinia D Trageser is a [age] y.o.  With a complex with a left complex adnexal masMeasuring 5.2 x 4.7 x 5.6 cm containing thick septations and a 9 mm solid nodule.  This is a new finding from the ultrasound in 2017.  CA-125 is within normal limits of 15.3.  Is counseled that the likelihood of malignancy is low.  The proposed procedure is a left oophorectomy and bilateral salpingectomy.  The patient strongly desires bilateral salpingo-oophorectomy so was counseled regarding the risks of cardiovascular disease osteoporosis and shorter life span associated with removal of ovaries prior to the age of 51.  She understands the information relayed and will consider this in her definitive decision making.  Ethlyn D Bozzo  was counseled that if malignancy is encountered the procedure will be extended to involve staging and debulking as indicated.  She is also aware that the area might be significant adhesive disease from the lap banding in 2009 and umbilical hernia repair in June 2009 the abdominal hysterectomy from July 2005 the abdominoplasty in June 1993 and the cesarean sections in 1987 in 1982.  The adhesive disease may compromise the ability to perform a minimally invasive procedure. The risk of the procedure discussed for infection bleeding damage to surrounding structures prolonged hospitalization and reoperation.  The date of the procedure is scheduled for September 04, 2017   .  HPI: Ms. Sherian Reinia D Heckert is a 51 y.o.  With a history is notable for total abdominal hysterectomy in 2005 for uterine leiomyoma.  In 2006 she began yearly monitoring for an ovarian cyst and the ultrasound in 2017 was within normal limits.  She reports that approximately 8 weeks ago she noticed some discomfort on the left lower quadrant  that was intermittent and sharp and spontaneously resolved.  An ultrasound obtained on July 30, 2017 was notable for an absent uterus and normal right ovary.  Left ovary is noted to measure 5.2 x 4.7 x 5.6 cm with a complex mass and a thick septation and a 9 mm solid nodule with vascular flow is appreciated.  There is no free pelvic fluid.  CA 125 obtained on July 30, 2017 returned a value of 15.3. Review of Systems:  Constitutional  Feels well,  Cardiovascular  No chest pain, shortness of breath, or edema  Pulmonary  No cough or wheeze.  Gastro Intestinal  No nausea, vomitting, or diarrhoea. No bright red blood per rectum, no abdominal pain, change in bowel movement, or constipation.  Genito Urinary  No frequency, urgency, dysuria Musculo Skeletal  No myalgia, arthralgia, joint swelling or pain  Neurologic  No weakness, numbness, change in gait,  Psychology  No depression, anxiety, insomnia.    Current Meds:  Outpatient Encounter Prescriptions as of 08/15/2017  Medication Sig  . amphetamine-dextroamphetamine (ADDERALL) 10 MG tablet TAKE 1 TABLET BY MOUTH EVERY MORNING  . aspirin 81 MG chewable tablet Chew 324 mg by mouth daily as needed. pain  . atorvastatin (LIPITOR) 80 MG tablet TAKE 1 TABLET BY MOUTH EVERY DAY  . Calcium Polycarbophil (FIBER-CAPS PO) Take 1 tablet by mouth daily.  Marland Kitchen. levothyroxine (SYNTHROID) 112 MCG tablet Take 112 mcg by mouth daily.    Marland Kitchen. loratadine (CLARITIN) 10 MG tablet Take 10 mg by mouth daily  as needed for allergies.  Marland Kitchen omeprazole (PRILOSEC) 20 MG capsule Take 20 mg by mouth daily as needed.  Marland Kitchen oxybutynin (DITROPAN) 5 MG tablet   . Vitamin D, Ergocalciferol, (DRISDOL) 50000 units CAPS capsule TAKE ONE CAPSULE BY MOUTH ONCE A WEEK   No facility-administered encounter medications on file as of 08/15/2017.     Allergy:  Allergies  Allergen Reactions  . Codeine Nausea And Vomiting  . Fentanyl Itching  . Tetanus-Diphth-Acell Pertussis [Diphth-Acell  Pertussis-Tetanus] Other (See Comments)    Arm turned black  . Demerol Itching and Nausea And Vomiting  . Pork-Derived Products Nausea And Vomiting    Social Hx:   Social History   Social History  . Marital status: Married    Spouse name: N/A  . Number of children: N/A  . Years of education: N/A   Occupational History  . Not on file.   Social History Main Topics  . Smoking status: Never Smoker  . Smokeless tobacco: Never Used  . Alcohol use Yes     Comment: Occasional drink about 1 time a month  . Drug use: No  . Sexual activity: Yes   Other Topics Concern  . Not on file   Social History Narrative  . No narrative on file    Past Surgical Hx:  Past Surgical History:  Procedure Laterality Date  . ABDOMINAL HYSTERECTOMY    . BREAST SURGERY     REDUCTION  . CESAREAN SECTION    . HERNIA REPAIR    . LAPBAND      Past Medical Hx:  Past Medical History:  Diagnosis Date  . Hyperlipidemia   . Thyroid disease     Past Gynecological History:  Gravida 2 para 2 hysterectomy in 2005 for leiomyoma and menorrhagia  Family Hx:  Family History  Problem Relation Age of Onset  . Hypertension Mother   . Ovarian cancer Paternal Grandmother     Vitals:  Blood pressure 118/79, pulse 66, temperature 97.6 F (36.4 C), temperature source Oral, resp. rate 18, height 5' (1.524 m), weight 175 lb 1.6 oz (79.4 kg), SpO2 100 %.  Physical Exam: WD in NAD Neck  Supple NROM, without any enlargements.  Lymph Node No cervical supraclavicular or inguinal adenopathy Cardiovascular  Pulse normal rate, regularity and rhythm. Lungs  Clear to auscultation bilaterally, without wheezes/crackles/rhonchi. Good air movement.  Skin  No rash/lesions/breakdown  Psychiatry  Alert and oriented appropriate mood affect speech and reasoning. Abdomen  Normoactive bowel sounds, abdomen soft, non-tender.  Numerous surgical  sites intact without evidence of hernia.  Back No CVA tenderness Genito  Urinary  Vulva/vagina: Normal external female genitalia.  No lesions. No discharge or bleeding.  Bladder/urethra:  No lesions or masses  Vagina: Cuff intact without masses  Adnexa: No palpable masses.  No cul-de-sac nodularity Rectal  Good tone, no masses no cul de sac nodularity.  Extremities  No bilateral cyanosis, clubbing or edema.   Laurette Schimke, MD, PhD 08/15/2017, 5:14 PM

## 2017-08-15 NOTE — Patient Instructions (Signed)
[facility] with Dr. Nelly RoutBrewster on September 04, 2017.  [facility] August 22, 2017 at 11 am [facility].  Please call for any questions or concerns.  You will be scheduled for a robotic assisted left oophorectomy, bilateral salpingectomy.

## 2017-08-16 ENCOUNTER — Telehealth: Payer: Self-pay | Admitting: *Deleted

## 2017-08-16 NOTE — Telephone Encounter (Signed)
Faxed Dr. Nelly RoutBrewster office note and patient [facility] [place] [facility]

## 2017-08-20 ENCOUNTER — Encounter (INDEPENDENT_AMBULATORY_CARE_PROVIDER_SITE_OTHER): Payer: Self-pay

## 2017-08-20 ENCOUNTER — Ambulatory Visit
Admission: RE | Admit: 2017-08-20 | Discharge: 2017-08-20 | Disposition: A | Discharge status: Home / Self Care | Payer: 59 | Source: Ambulatory Visit | Attending: Physician Assistant | Admitting: Physician Assistant

## 2017-08-20 DIAGNOSIS — Z1231 Encounter for screening mammogram for malignant neoplasm of breast: Secondary | ICD-10-CM

## 2017-08-22 ENCOUNTER — Encounter: Payer: 59 | Admitting: Obstetrics & Gynecology

## 2017-08-22 DIAGNOSIS — E039 Hypothyroidism, unspecified: Secondary | ICD-10-CM | POA: Diagnosis not present

## 2017-08-22 DIAGNOSIS — R1900 Intra-abdominal and pelvic swelling, mass and lump, unspecified site: Secondary | ICD-10-CM | POA: Diagnosis not present

## 2017-08-22 DIAGNOSIS — Z01818 Encounter for other preprocedural examination: Secondary | ICD-10-CM | POA: Diagnosis not present

## 2017-08-26 ENCOUNTER — Ambulatory Visit: Payer: 59 | Admitting: Gynecologic Oncology

## 2017-08-28 ENCOUNTER — Ambulatory Visit: Payer: 59 | Admitting: Gynecologic Oncology

## 2017-09-04 DIAGNOSIS — R1904 Left lower quadrant abdominal swelling, mass and lump: Secondary | ICD-10-CM | POA: Diagnosis not present

## 2017-09-04 DIAGNOSIS — N949 Unspecified condition associated with female genital organs and menstrual cycle: Secondary | ICD-10-CM | POA: Diagnosis not present

## 2017-09-04 DIAGNOSIS — K660 Peritoneal adhesions (postprocedural) (postinfection): Secondary | ICD-10-CM | POA: Diagnosis not present

## 2017-09-04 DIAGNOSIS — N736 Female pelvic peritoneal adhesions (postinfective): Secondary | ICD-10-CM | POA: Diagnosis not present

## 2017-10-17 ENCOUNTER — Encounter (HOSPITAL_COMMUNITY): Payer: Self-pay

## 2017-11-01 DIAGNOSIS — N9489 Other specified conditions associated with female genital organs and menstrual cycle: Secondary | ICD-10-CM | POA: Insufficient documentation

## 2018-01-13 DIAGNOSIS — J329 Chronic sinusitis, unspecified: Secondary | ICD-10-CM | POA: Diagnosis not present

## 2018-01-20 DIAGNOSIS — L820 Inflamed seborrheic keratosis: Secondary | ICD-10-CM | POA: Diagnosis not present

## 2018-01-20 DIAGNOSIS — Z1283 Encounter for screening for malignant neoplasm of skin: Secondary | ICD-10-CM | POA: Diagnosis not present

## 2018-02-11 DIAGNOSIS — E039 Hypothyroidism, unspecified: Secondary | ICD-10-CM | POA: Diagnosis not present

## 2018-02-11 DIAGNOSIS — E559 Vitamin D deficiency, unspecified: Secondary | ICD-10-CM | POA: Diagnosis not present

## 2018-02-11 DIAGNOSIS — E785 Hyperlipidemia, unspecified: Secondary | ICD-10-CM | POA: Diagnosis not present

## 2018-05-22 ENCOUNTER — Other Ambulatory Visit: Payer: Self-pay | Admitting: Family Medicine

## 2018-05-22 DIAGNOSIS — N644 Mastodynia: Secondary | ICD-10-CM | POA: Diagnosis not present

## 2018-05-28 ENCOUNTER — Other Ambulatory Visit: Payer: 59

## 2018-05-30 ENCOUNTER — Ambulatory Visit
Admission: RE | Admit: 2018-05-30 | Discharge: 2018-05-30 | Disposition: A | Discharge status: Home / Self Care | Payer: 59 | Source: Ambulatory Visit | Attending: Family Medicine | Admitting: Family Medicine

## 2018-05-30 DIAGNOSIS — R928 Other abnormal and inconclusive findings on diagnostic imaging of breast: Secondary | ICD-10-CM | POA: Diagnosis not present

## 2018-05-30 DIAGNOSIS — N644 Mastodynia: Secondary | ICD-10-CM

## 2018-05-30 DIAGNOSIS — N6489 Other specified disorders of breast: Secondary | ICD-10-CM | POA: Diagnosis not present

## 2018-07-01 DIAGNOSIS — E039 Hypothyroidism, unspecified: Secondary | ICD-10-CM | POA: Insufficient documentation

## 2018-07-01 DIAGNOSIS — M25561 Pain in right knee: Secondary | ICD-10-CM | POA: Diagnosis not present

## 2018-07-07 ENCOUNTER — Telehealth: Payer: Self-pay | Admitting: Cardiology

## 2018-07-07 NOTE — Telephone Encounter (Signed)
Returned call to patient no answer.LMTC. 

## 2018-07-07 NOTE — Progress Notes (Signed)
Cardiology Office Note   Date:  07/08/2018   ID:  Connie [name], DOB 01/16/1966, MRN [id number]  PCP:  Milus Heightedmon, Noelle, PA-C  Cardiologist:   No primary care provider on file. Referring:  Milus Heightedmon, Noelle, PA-C  Chief Complaint  Patient presents with  . Chest Pain      History of Present Illness: Connie [name] is a 52 y.o. female who is referred by Milus Heightedmon, Noelle, PA-C for evaluation of chest pain.  The patient has no past cardiac history but she does have risk factors with dyslipidemia and a family history.  She was concerned because she is been having some discomfort for some time which is a fullness under her left breast.  However, on Friday of last week she had severe discomfort in her mid chest.  It felt like she was trying to swallow a whole apple.  It was at rest.  She took Prilosec without relief.  She had a peppermint and that helped a little bit.  It was not like her previous reflux.  She took some Tums and it would ease of the little bit it would come back.  It lasted for at least 12 hours.  Baking soda seem to help.  It was 8 out of 10 in intensity.  There was no associated nausea vomiting or diaphoresis.  There was no shortness of breath.  There was a little radiation to the right jaw.  It finally eased off.  She was able to do some exercising after that and not bring this on.  She does not have this severe kind of pain before.  It is not made worse with movement or breathing.  She otherwise is able to exercise and does not have any shortness of breath, palpitations, presyncope or syncope.  She denies any PND or orthopnea.  She has not had any prior cardiac testing.   Past Medical History:  Diagnosis Date  . Hyperlipidemia   . Thyroid disease     Past Surgical History:  Procedure Laterality Date  . ABDOMINAL HYSTERECTOMY    . BREAST SURGERY     REDUCTION  . CESAREAN SECTION    . HERNIA REPAIR    . LAPAROSCOPIC OOPHERECTOMY Left   . LAPBAND    . REDUCTION MAMMAPLASTY  Bilateral      Current Outpatient Medications  Medication Sig Dispense Refill  . amphetamine-dextroamphetamine (ADDERALL) 10 MG tablet TAKE 1 TABLET BY MOUTH EVERY MORNING    . atorvastatin (LIPITOR) 80 MG tablet TAKE 1 TABLET BY MOUTH EVERY DAY    . Calcium Polycarbophil (FIBER-CAPS PO) Take 1 tablet by mouth daily.    Marland Kitchen. levothyroxine (SYNTHROID) 112 MCG tablet Take 112 mcg by mouth daily.      Marland Kitchen. loratadine (CLARITIN) 10 MG tablet Take 10 mg by mouth daily as needed for allergies.    Marland Kitchen. omeprazole (PRILOSEC) 20 MG capsule Take 20 mg by mouth daily as needed.    Marland Kitchen. oxybutynin (DITROPAN) 5 MG tablet      No current facility-administered medications for this visit.     Allergies:   Codeine; Fentanyl; Tetanus-diphth-acell pertussis [tetanus-diphth-acell pertussis]; Demerol; and Pork-derived products    Social History:  The patient  reports that she has never smoked. She has never used smokeless tobacco. She reports that she drinks alcohol. She reports that she does not use drugs.   Family History:  The patient's family history includes Hypertension in her father and mother; Ovarian cancer in her paternal grandmother.  There is a family history of her maternal grandfather's brothers and sisters and multiple of their children having early onset coronary disease.   ROS:  Please see the history of present illness.   Otherwise, review of systems are positive for none.   All other systems are reviewed and negative.    PHYSICAL EXAM: VS:  BP 126/82   Pulse (!) 56   Ht 4\' 11"  (1.499 m)   Wt 171 lb 12.8 oz (77.9 kg)   BMI 34.70 kg/m  , BMI Body mass index is 34.7 kg/m. GENERAL:  Well appearing HEENT:  Pupils equal round and reactive, fundi not visualized, oral mucosa unremarkable NECK:  No jugular venous distention, waveform within normal limits, carotid upstroke brisk and symmetric, no bruits, no thyromegaly LYMPHATICS:  No cervical, inguinal adenopathy LUNGS:  Clear to auscultation  bilaterally BACK:  No CVA tenderness CHEST:  Unremarkable HEART:  PMI not displaced or sustained,S1 and S2 within normal limits, no S3, no S4, no clicks, no rubs, no murmurs ABD:  Flat, positive bowel sounds normal in frequency in pitch, no bruits, no rebound, no guarding, no midline pulsatile mass, no hepatomegaly, no splenomegaly EXT:  2 plus pulses throughout, no edema, no cyanosis no clubbing SKIN:  No rashes no nodules NEURO:  Cranial nerves II through XII grossly intact, motor grossly intact throughout PSYCH:  Cognitively intact, oriented to person place and time    EKG:  EKG is ordered today. The ekg ordered today demonstrates sinus rhythm, rate 56, axis within normal limits, intervals within normal limits, no acute ST-T wave changes.   Recent Labs: No results found for requested labs within last 8760 hours.    Lipid Panel No results found for: CHOL, TRIG, HDL, CHOLHDL, VLDL, LDLCALC, LDLDIRECT    Wt Readings from Last 3 Encounters:  07/08/18 171 lb 12.8 oz (77.9 kg)  08/15/17 175 lb 1.6 oz (79.4 kg)  02/05/13 174 lb 9.6 oz (79.2 kg)      Other studies Reviewed: Additional studies/ records that were reviewed today include: Labs. Review of the above records demonstrates:  Please see elsewhere in the note.     ASSESSMENT AND PLAN:   CHEST PAIN: The pain has atypical greater than typical features but there are risk factors.  I think is reasonable to start with a coronary calcium score.  Further evaluation will be based on these results.  DYSLIPIDEMIA:   LDL is well controlled and no change in therapy is indicated.   Current medicines are reviewed at length with the patient today.  The patient does not have concerns regarding medicines.  The following changes have been made:  no change  Labs/ tests ordered today include:   Orders Placed This Encounter  Procedures  . CT CARDIAC SCORING  . EKG 12-Lead     Disposition:   FU with me as needed      Signed, Rollene Rotunda, MD  07/08/2018 5:16 PM    [facility]

## 2018-07-08 ENCOUNTER — Ambulatory Visit: Payer: 59 | Admitting: Cardiology

## 2018-07-08 ENCOUNTER — Encounter: Payer: Self-pay | Admitting: Cardiology

## 2018-07-08 VITALS — BP 126/82 | HR 56 | Ht 59.0 in | Wt 171.8 lb

## 2018-07-08 DIAGNOSIS — E785 Hyperlipidemia, unspecified: Secondary | ICD-10-CM

## 2018-07-08 DIAGNOSIS — R079 Chest pain, unspecified: Secondary | ICD-10-CM | POA: Diagnosis not present

## 2018-07-08 NOTE — Patient Instructions (Signed)
Medication Instructions:  Continue current medications  If you need a refill on your cardiac medications before your next appointment, please call your pharmacy.  Labwork: None Ordered   Testing/Procedures: Your physician has requested that you have a Coronary Calcium Score. This test is done at our [address] Office.   Follow-Up: Your physician wants you to follow-up in: As Needed.      Thank you for choosing [facility]!!       

## 2018-07-08 NOTE — Telephone Encounter (Signed)
Appointment scheduled with Dr.Hochrein 07/08/18 at 3:40 pm.

## 2018-07-21 DIAGNOSIS — M25561 Pain in right knee: Secondary | ICD-10-CM | POA: Diagnosis not present

## 2018-07-22 ENCOUNTER — Telehealth: Payer: Self-pay | Admitting: Cardiology

## 2018-07-22 NOTE — Telephone Encounter (Signed)
Left message for pt to call back... Message left after 5pm

## 2018-07-22 NOTE — Telephone Encounter (Signed)
New message  Pt c/o of Chest Pain: STAT if CP now or developed within 24 hours  1. Are you having CP right now? No   2. Are you experiencing any other symptoms (ex. SOB, nausea, vomiting, sweating)?no   3. How long have you been experiencing CP? For about a month  4. Is your CP continuous or coming and going?coming and going  5. Have you taken Nitroglycerin? No  ?

## 2018-07-23 ENCOUNTER — Inpatient Hospital Stay: Admission: RE | Admit: 2018-07-23 | Payer: 59 | Source: Ambulatory Visit

## 2018-07-23 NOTE — Telephone Encounter (Signed)
Called to check on pt. Pt sts that she cancelled the CT calcium score because she did not think it would be beneficial in determining what is causing her cheat discomfort. She is still having atypical chest pain. She is concerned that she may have a blockage and would like to know if Dr.Hochrein thinks she needs to have additional testing. Adv pt that I will fwd the message to Dr.Hochrein and we will call back with his response.

## 2018-07-25 NOTE — Telephone Encounter (Signed)
She could be scheduled instead for a POET ([place] Exercise Treadmill).  Call Ms. Bodenheimer with this suggestion and schedule.

## 2018-07-28 NOTE — Telephone Encounter (Signed)
Leave message for pt to call back 

## 2018-07-29 NOTE — Telephone Encounter (Signed)
LMTCB

## 2018-07-30 DIAGNOSIS — M25561 Pain in right knee: Secondary | ICD-10-CM | POA: Diagnosis not present

## 2018-07-31 NOTE — Telephone Encounter (Signed)
Left message to return call to discuss further testing.

## 2018-08-04 NOTE — Telephone Encounter (Signed)
Spoke with pt she stated what she was feeling seems to have subsided and will call our office back if she need to have any testing done.

## 2018-08-06 DIAGNOSIS — M1711 Unilateral primary osteoarthritis, right knee: Secondary | ICD-10-CM | POA: Diagnosis not present

## 2018-08-07 DIAGNOSIS — Z4651 Encounter for fitting and adjustment of gastric lap band: Secondary | ICD-10-CM | POA: Diagnosis not present

## 2018-08-11 ENCOUNTER — Other Ambulatory Visit (HOSPITAL_COMMUNITY): Payer: Self-pay | Admitting: General Surgery

## 2018-08-11 DIAGNOSIS — Z9884 Bariatric surgery status: Secondary | ICD-10-CM

## 2018-08-25 DIAGNOSIS — M25561 Pain in right knee: Secondary | ICD-10-CM | POA: Insufficient documentation

## 2018-08-25 DIAGNOSIS — M1711 Unilateral primary osteoarthritis, right knee: Secondary | ICD-10-CM | POA: Diagnosis not present

## 2018-08-29 ENCOUNTER — Ambulatory Visit (HOSPITAL_COMMUNITY): Payer: 59

## 2018-09-01 DIAGNOSIS — M1711 Unilateral primary osteoarthritis, right knee: Secondary | ICD-10-CM | POA: Diagnosis not present

## 2018-09-03 ENCOUNTER — Ambulatory Visit (HOSPITAL_COMMUNITY)
Admission: RE | Admit: 2018-09-03 | Discharge: 2018-09-03 | Disposition: A | Discharge status: Home / Self Care | Payer: 59 | Source: Ambulatory Visit | Attending: General Surgery | Admitting: General Surgery

## 2018-09-03 DIAGNOSIS — K5710 Diverticulosis of small intestine without perforation or abscess without bleeding: Secondary | ICD-10-CM | POA: Insufficient documentation

## 2018-09-03 DIAGNOSIS — Z9884 Bariatric surgery status: Secondary | ICD-10-CM

## 2018-09-03 DIAGNOSIS — K228 Other specified diseases of esophagus: Secondary | ICD-10-CM | POA: Insufficient documentation

## 2018-09-03 DIAGNOSIS — K219 Gastro-esophageal reflux disease without esophagitis: Secondary | ICD-10-CM | POA: Diagnosis not present

## 2018-09-08 DIAGNOSIS — M1711 Unilateral primary osteoarthritis, right knee: Secondary | ICD-10-CM | POA: Diagnosis not present

## 2018-10-02 DIAGNOSIS — R109 Unspecified abdominal pain: Secondary | ICD-10-CM | POA: Diagnosis not present

## 2018-10-02 DIAGNOSIS — Z9884 Bariatric surgery status: Secondary | ICD-10-CM | POA: Diagnosis not present

## 2018-10-02 DIAGNOSIS — Z4651 Encounter for fitting and adjustment of gastric lap band: Secondary | ICD-10-CM | POA: Diagnosis not present

## 2018-10-07 ENCOUNTER — Other Ambulatory Visit: Payer: Self-pay | Admitting: Student

## 2018-10-07 DIAGNOSIS — R109 Unspecified abdominal pain: Secondary | ICD-10-CM

## 2018-10-10 DIAGNOSIS — Z0000 Encounter for general adult medical examination without abnormal findings: Secondary | ICD-10-CM | POA: Diagnosis not present

## 2018-10-10 DIAGNOSIS — E039 Hypothyroidism, unspecified: Secondary | ICD-10-CM | POA: Diagnosis not present

## 2018-10-10 DIAGNOSIS — E7800 Pure hypercholesterolemia, unspecified: Secondary | ICD-10-CM | POA: Diagnosis not present

## 2018-10-10 DIAGNOSIS — E559 Vitamin D deficiency, unspecified: Secondary | ICD-10-CM | POA: Diagnosis not present

## 2018-11-11 NOTE — Progress Notes (Deleted)
Cardiology Office Note   Date:  11/11/2018   ID:  Connie [name], DOB 1966/05/29, MRN [id number]  PCP:  Milus Height, PA-C  Cardiologist:  Hochrein  No chief complaint on file.    History of Present Illness: Connie [name] is a 53 y.o. female who presents for ongoing assessment and management of chest pain. She has a history of hyperlipidemia and thyroid disease. She was scheduled for cardiac CTA for further evaluation.She did not have this completed, and therefore a POET was ordered. She did not have this completed either. She has a history of a lap band, with recent UGI Barium swallow on 09/03/2018 . This did not show obstruction, GERD was noted.     Past Medical History:  Diagnosis Date  . Hyperlipidemia   . Thyroid disease     Past Surgical History:  Procedure Laterality Date  . ABDOMINAL HYSTERECTOMY    . BREAST SURGERY     REDUCTION  . CESAREAN SECTION    . HERNIA REPAIR    . LAPAROSCOPIC OOPHERECTOMY Left   . LAPBAND    . REDUCTION MAMMAPLASTY Bilateral      Current Outpatient Medications  Medication Sig Dispense Refill  . amphetamine-dextroamphetamine (ADDERALL) 10 MG tablet TAKE 1 TABLET BY MOUTH EVERY MORNING    . atorvastatin (LIPITOR) 80 MG tablet TAKE 1 TABLET BY MOUTH EVERY DAY    . Calcium Polycarbophil (FIBER-CAPS PO) Take 1 tablet by mouth daily.    Marland Kitchen levothyroxine (SYNTHROID) 112 MCG tablet Take 112 mcg by mouth daily.      Marland Kitchen loratadine (CLARITIN) 10 MG tablet Take 10 mg by mouth daily as needed for allergies.    Marland Kitchen omeprazole (PRILOSEC) 20 MG capsule Take 20 mg by mouth daily as needed.    Marland Kitchen oxybutynin (DITROPAN) 5 MG tablet      No current facility-administered medications for this visit.     Allergies:   Codeine; Fentanyl; Tetanus-diphth-acell pertussis [tetanus-diphth-acell pertussis]; Demerol; and Pork-derived products    Social History:  The patient  reports that she has never smoked. She has never used smokeless tobacco. She reports current  alcohol use. She reports that she does not use drugs.   Family History:  The patient's family history includes Hypertension in her father and mother; Ovarian cancer in her paternal grandmother.    ROS: All other systems are reviewed and negative. Unless otherwise mentioned in H&P    PHYSICAL EXAM: VS:  There were no vitals taken for this visit. , BMI There is no height or weight on file to calculate BMI. GEN: Well nourished, well developed, in no acute distress HEENT: normal Neck: no JVD, carotid bruits, or masses Cardiac: ***RRR; no murmurs, rubs, or gallops,no edema  Respiratory:  Clear to auscultation bilaterally, normal work of breathing GI: soft, nontender, nondistended, + BS MS: no deformity or atrophy Skin: warm and dry, no rash Neuro:  Strength and sensation are intact Psych: euthymic mood, full affect   EKG:  EKG {ACTION; IS/IS [id number]} ordered today. The ekg ordered today demonstrates ***   Recent Labs: No results found for requested labs within last 8760 hours.    Lipid Panel No results found for: CHOL, TRIG, HDL, CHOLHDL, VLDL, LDLCALC, LDLDIRECT    Wt Readings from Last 3 Encounters:  07/08/18 171 lb 12.8 oz (77.9 kg)  08/15/17 175 lb 1.6 oz (79.4 kg)  02/05/13 174 lb 9.6 oz (79.2 kg)      Other studies Reviewed: Additional studies/ records that were reviewed  today include: ***. Review of the above records demonstrates: ***   ASSESSMENT AND PLAN:  1.  ***   Current medicines are reviewed at length with the patient today.    Labs/ tests ordered today include: *** Bettey Mare. Liborio Nixon, ANP, [facility]   11/11/2018 6:40 AM    [facility] [address] Office [phone] Fax [phone]

## 2018-11-12 ENCOUNTER — Ambulatory Visit: Payer: 59 | Admitting: Adult Health

## 2018-11-18 ENCOUNTER — Ambulatory Visit: Payer: 59 | Admitting: Adult Health

## 2018-11-19 ENCOUNTER — Encounter: Payer: Self-pay | Admitting: Adult Health

## 2018-11-19 ENCOUNTER — Ambulatory Visit: Payer: 59 | Admitting: Adult Health

## 2018-11-19 VITALS — BP 124/72 | HR 52 | Ht 60.0 in | Wt 178.0 lb

## 2018-11-19 DIAGNOSIS — R079 Chest pain, unspecified: Secondary | ICD-10-CM

## 2018-11-19 DIAGNOSIS — I519 Heart disease, unspecified: Secondary | ICD-10-CM

## 2018-11-19 DIAGNOSIS — R5383 Other fatigue: Secondary | ICD-10-CM

## 2018-11-19 NOTE — Patient Instructions (Signed)
Testing/Procedures: Echocardiogram - Your physician has requested that you have an echocardiogram. Echocardiography is a painless test that uses sound waves to create images of your heart. It provides your doctor with information about the size and shape of your heart and how well your heart's chambers and valves are working. This procedure takes approximately one hour. There are no restrictions for this procedure. This will be performed at our [facility] location - [address].  Your physician has requested that you have an Exercise Myoview. A cardiac stress test is a cardiological test that measures the heart's ability to respond to external stress in a controlled clinical environment. The stress response is induced by exercise (exercise-treadmill). For further information please visit [email or web address] If you have questions or concerns about your appointment, you can call the Nuclear Lab at [phone].   Medication Instructions:  NO CHANGES- Your physician recommends that you continue on your current medications as directed. Please refer to the Current Medication list given to you today. If you need a refill on your cardiac medications before your next appointment, please call your pharmacy.  Labwork: NON ORDERED When you have your labs (blood work) drawn today and your tests are completely normal, you will receive your results only by MyChart Message (if you have MyChart) -OR-  A paper copy in the mail.  If you have any lab test that is abnormal or we need to change your treatment, we will call you to review these results.  Follow-Up: You will need a follow up appointment AFTER TESTING.  You may see Rollene Rotunda, MD Joni Reining, DNP, AACC or one of the following Advanced Practice Providers on your designated Care Team:  Joni Reining, DNP, AACC  Theodore Demark, PA-C At [facility], you and your health needs are our priority.  As part of our continuing mission to  provide you with exceptional heart care, we have created designated Provider Care Teams.  These Care Teams include your primary Cardiologist (physician) and Advanced Practice Providers (APPs -  Physician Assistants and Nurse Practitioners) who all work together to provide you with the care you need, when you need it.  Thank you for choosing [facility] at [facility]!!

## 2018-11-19 NOTE — Progress Notes (Signed)
Cardiology Office Note   Date:  11/19/2018   ID:  Connie [name], DOB 06/27/1966, MRN [id number]  PCP:  Milus Heightedmon, Noelle, PA-C  [facility]   Chief Complaint  Patient presents with  . Chest Pain    center left side of chest     History of Present Illness: Connie [name] is a 53 y.o. female who presents for ongoing assessment and management of chest pain and fullness, described as "having to swallow a whole apple." The discomfort occurred at rest with some radiation to the right jaw.  When last seen by Dr. Antoine PocheHochrein, on 07/22/2018, she was scheduled for a cardiac CTA. She chose not to proceed with this testing, and was advised to switch to a POET. She did not wish to proceed with that either.   The patient comes today stating that she was willing to proceed with the stress test but did not receive a phone call about having one scheduled.   She continues to have chest pressure on the left side of her chest. She has seen GI and PCP for evaluations, EDG and Barium swallow testing was completed with normal results.  She has a lap band and is followed by Bariatric surgeon who also states that everything is in order.   She remains active walking on the treadmill and playing with her grandchildren but has noticed a worsening of her fatigue. She also feels a "rubbing sensation" in her chest with exertion.    Other history includes thyroid disease, hyperlipidemia, obesity s/p Lap Band.   Past Medical History:  Diagnosis Date  . Hyperlipidemia   . Thyroid disease     Past Surgical History:  Procedure Laterality Date  . ABDOMINAL HYSTERECTOMY    . BREAST SURGERY     REDUCTION  . CESAREAN SECTION    . HERNIA REPAIR    . LAPAROSCOPIC OOPHERECTOMY Left   . LAPBAND    . REDUCTION MAMMAPLASTY Bilateral      Current Outpatient Medications  Medication Sig Dispense Refill  . amphetamine-dextroamphetamine (ADDERALL) 10 MG tablet TAKE 1 TABLET BY MOUTH EVERY MORNING    . atorvastatin  (LIPITOR) 80 MG tablet TAKE 1 TABLET BY MOUTH EVERY DAY    . Calcium Polycarbophil (FIBER-CAPS PO) Take 1 tablet by mouth daily.    Marland Kitchen. levothyroxine (SYNTHROID) 112 MCG tablet Take 112 mcg by mouth daily.      Marland Kitchen. loratadine (CLARITIN) 10 MG tablet Take 10 mg by mouth daily as needed for allergies.    Marland Kitchen. omeprazole (PRILOSEC) 20 MG capsule Take 20 mg by mouth daily as needed.    Marland Kitchen. oxybutynin (DITROPAN) 5 MG tablet      No current facility-administered medications for this visit.     Allergies:   Codeine; Fentanyl; Tetanus-diphth-acell pertussis [tetanus-diphth-acell pertussis]; Demerol; and Pork-derived products    Social History:  The patient  reports that she has never smoked. She has never used smokeless tobacco. She reports current alcohol use. She reports that she does not use drugs.   Family History:  The patient's family history includes Hypertension in her father and mother; Ovarian cancer in her paternal grandmother.    ROS: All other systems are reviewed and negative. Unless otherwise mentioned in H&P    PHYSICAL EXAM: VS:  BP 124/72   Pulse (!) 52   Ht 5' (1.524 m)   Wt 178 lb (80.7 kg)   BMI 34.76 kg/m  , BMI Body mass index is 34.76 kg/m. GEN: Well nourished, well  developed, in no acute distress HEENT: normal Neck: no JVD, carotid bruits, or masses Cardiac: RRR; no murmurs, rubs, or gallops,no edema  Respiratory:  Clear to auscultation bilaterally, normal work of breathing GI: soft, nontender, nondistended, + BS MS: no deformity or atrophy Skin: warm and dry, no rash Neuro:  Strength and sensation are intact Psych: euthymic mood, full affect   EKG:  Sinus bradycardia rate of 52 bpm.   Recent Labs: No results found for requested labs within last 8760 hours.    Lipid Panel No results found for: CHOL, TRIG, HDL, CHOLHDL, VLDL, LDLCALC, LDLDIRECT    Wt Readings from Last 3 Encounters:  11/19/18 178 lb (80.7 kg)  07/08/18 171 lb 12.8 oz (77.9 kg)  08/15/17  175 lb 1.6 oz (79.4 kg)      Other studies Reviewed: None   ASSESSMENT AND PLAN:  1. Chest pressure: CVRF FH, thyroid disease, HL. I will schedule a NM stress test for diagnostic/prognostic purposes. I have explained the procedure and she is willing to proceed. There are no medications that need to be held. I will check echo for changes in LV function causing fatigue and "rubbing feeling"  2. Hypothyroidism: She is followed by her PCP for ongoing management.   3. Hyperlipidemia; Continues on statin therapy. Followed by PCP  Current medicines are reviewed at length with the patient today.  No changes  Labs/ tests ordered today include: NM Stress test and echocardiogram   Bettey MareKathryn M. Liborio NixonLawrence DNP, ANP, AACC   11/19/2018 9:31 AM    [facility] [address] Office [phone] Fax [phone]

## 2018-11-25 ENCOUNTER — Telehealth (HOSPITAL_COMMUNITY): Payer: Self-pay

## 2018-11-25 NOTE — Telephone Encounter (Signed)
Patient contacted and detailed instructions given. Pt stated that she understood and would be here for her test. Connie [name] EMTP

## 2018-11-27 ENCOUNTER — Ambulatory Visit (HOSPITAL_COMMUNITY): Payer: 59 | Attending: Cardiovascular Disease

## 2018-11-27 ENCOUNTER — Ambulatory Visit (HOSPITAL_BASED_OUTPATIENT_CLINIC_OR_DEPARTMENT_OTHER): Payer: 59

## 2018-11-27 DIAGNOSIS — I519 Heart disease, unspecified: Secondary | ICD-10-CM | POA: Insufficient documentation

## 2018-11-27 DIAGNOSIS — R079 Chest pain, unspecified: Secondary | ICD-10-CM

## 2018-11-27 DIAGNOSIS — R5383 Other fatigue: Secondary | ICD-10-CM

## 2018-11-27 LAB — MYOCARDIAL PERFUSION IMAGING
CHL CUP NUCLEAR SDS: 7
CHL CUP RESTING HR STRESS: 53 {beats}/min
CSEPED: 9 min
Estimated workload: 10.1 METS
Exercise duration (sec): 0 s
LV dias vol: 65 mL (ref 46–106)
LV sys vol: 15 mL
MPHR: 167 {beats}/min
NUC STRESS TID: 0.88
Peak HR: 144 {beats}/min
Percent HR: 86 %
SRS: 0
SSS: 7

## 2018-11-27 LAB — ECHOCARDIOGRAM COMPLETE
HEIGHTINCHES: 60 in
Weight: 2848 oz

## 2018-11-27 MED ORDER — TECHNETIUM TC 99M TETROFOSMIN IV KIT
10.1000 | PACK | Freq: Once | INTRAVENOUS | Status: AC | PRN
  Administered 2018-11-27: 10.1 via INTRAVENOUS
  Filled 2018-11-27: qty 11

## 2018-11-27 MED ORDER — TECHNETIUM TC 99M TETROFOSMIN IV KIT
31.1000 | PACK | Freq: Once | INTRAVENOUS | Status: AC | PRN
  Administered 2018-11-27: 31.1 via INTRAVENOUS
  Filled 2018-11-27: qty 32

## 2018-12-04 ENCOUNTER — Telehealth: Payer: Self-pay

## 2018-12-04 NOTE — Telephone Encounter (Signed)
THIS ECHO HAS NOT BEEN REVIEWED. PLEASE REVIEW AND SEND BACK SO I CAN CALL PT

## 2018-12-05 NOTE — Telephone Encounter (Signed)
Echocardiogram shows normal heart function without significant valvular abnormalities. Good report.

## 2018-12-05 NOTE — Progress Notes (Signed)
Cardiology Office Note   Date:  12/08/2018   ID:  Connie [name], DOB 1966-05-08, MRN [id number]  PCP:  Milus Height, PA-C  Cardiologist: [facility]   Chief Complaint  Patient presents with  . Follow-up  . Chest Pain     History of Present Illness: Connie [name] is a 53 y.o. female who presents for ongoing assessment and management of chest pain,with radiation to the right jaw. On last office visit she was scheduled for a NM stress test and echocardiogram due to multiple CVRF and symptoms. Stress test and echo were normal.  She has other history of thyroid disease and hyperlipidemia.   She comes today with continued discomfort. Although I have explained to her that her stress test and echo was normal, she is not convinced that chest discomfort is not related to CAD. She states that her friend;s husband also had normal stress test and echo, but continued to have pain and required stent placement.   She states that she walks on a treadmill for 30-45 minutes a day, without worsening of chest pain. She had continued intermittent chest pressure with and without exertion. She remains anxious about possibility of CAD.  Past Medical History:  Diagnosis Date  . Hyperlipidemia   . Thyroid disease     Past Surgical History:  Procedure Laterality Date  . ABDOMINAL HYSTERECTOMY    . BREAST SURGERY     REDUCTION  . CESAREAN SECTION    . HERNIA REPAIR    . LAPAROSCOPIC OOPHERECTOMY Left   . LAPBAND    . REDUCTION MAMMAPLASTY Bilateral      Current Outpatient Medications  Medication Sig Dispense Refill  . amphetamine-dextroamphetamine (ADDERALL) 10 MG tablet TAKE 1 TABLET BY MOUTH EVERY MORNING    . atorvastatin (LIPITOR) 80 MG tablet TAKE 1 TABLET BY MOUTH EVERY DAY    . Calcium Polycarbophil (FIBER-CAPS PO) Take 1 tablet by mouth daily.    Marland Kitchen levothyroxine (SYNTHROID) 112 MCG tablet Take 112 mcg by mouth daily.      Marland Kitchen loratadine (CLARITIN) 10 MG tablet Take 10 mg by mouth daily as needed  for allergies.    Marland Kitchen omeprazole (PRILOSEC) 20 MG capsule Take 20 mg by mouth daily as needed.    Marland Kitchen oxybutynin (DITROPAN) 5 MG tablet     . metoprolol tartrate (LOPRESSOR) 50 MG tablet Take 1 tablet (50 mg total) by mouth 2 (two) times daily. 1 tablet 0  . Vitamin D, Ergocalciferol, (DRISDOL) 1.25 MG (50000 UT) CAPS capsule      No current facility-administered medications for this visit.     Allergies:   Codeine; Fentanyl; Other; Tetanus-diphth-acell pertussis [tetanus-diphth-acell pertussis]; Demerol; Pork-derived products; and Poractant alfa    Social History:  The patient  reports that she has never smoked. She has never used smokeless tobacco. She reports current alcohol use. She reports that she does not use drugs.   Family History:  The patient's family history includes Hypertension in her father and mother; Ovarian cancer in her paternal grandmother.    ROS: All other systems are reviewed and negative. Unless otherwise mentioned in H&P    PHYSICAL EXAM: VS:  BP 120/72   Pulse 63   Ht 5' (1.524 m)   Wt 180 lb (81.6 kg)   SpO2 98%   BMI 35.15 kg/m  , BMI Body mass index is 35.15 kg/m. GEN: Well nourished, well developed, in no acute distress HEENT: normal Neck: no JVD, carotid bruits, or masses Cardiac: RRR; no murmurs,  rubs, or gallops,no edema  Respiratory:  Clear to auscultation bilaterally, normal work of breathing GI: soft, nontender, nondistended, + BS MS: no deformity or atrophy Skin: warm and dry, no rash Neuro:  Strength and sensation are intact Psych: euthymic mood, full affect   EKG:  Not completed.   Recent Labs: No results found for requested labs within last 8760 hours.    Lipid Panel No results found for: CHOL, TRIG, HDL, CHOLHDL, VLDL, LDLCALC, LDLDIRECT    Wt Readings from Last 3 Encounters:  12/08/18 180 lb (81.6 kg)  11/27/18 178 lb (80.7 kg)  11/19/18 178 lb (80.7 kg)      Other studies Reviewed: NM Stress Test  11/27/2018    Nuclear stress EF: 76%.  Blood pressure demonstrated a normal response to exercise.  There was no ST segment deviation noted during stress.  The study is normal.  This is a low risk study.  The left ventricular ejection fraction is hyperdynamic (>65%).     Echocardiogram 11/27/2018 1. The left ventricle has normal systolic function of 60-65%. The cavity size was normal. There is no increased left ventricular wall thickness. Echo evidence of normal diastolic relaxation and indeterminate ventricular filling pressure.  2. The right ventricle has normal systolic function. The cavity was normal. There is no increase in right ventricular wall thickness. Right ventricular systolic pressure normal with an estimated pressure of 24.5 mmHg.  3. The mitral valve is normal in structure.  4. The tricuspid valve is normal in structure.  5. The aortic valve is tricuspid.  6. The pulmonic valve was normal in structure.    ASSESSMENT AND PLAN:  1. Ongoing chest pain: She has normal NM stress test and echocardiogram. Although this has been explained to her, she states she still thinks that this is related to a blockage. She keeps going back to her friend who required a stent. Despite these normal test results she wants a more definitive test. I have offered her a cardia CTA. I do not think she needs a cath at this time.  I take the least invasive approach for now. CTA will be scheduled.  2. ADD: She is on Adderal. HR is normal today.   3. Hyperlipidemia: Continue atorvastatin 80 mg . Will need follow up lipids and LFT's on next office visit.   Current medicines are reviewed at length with the patient today.    Labs/ tests ordered today include: Cardiac CTA  Bettey Mare. Liborio Nixon, ANP, [facility]   12/08/2018 10:24 AM    [facility] [address] Office [phone] Fax [phone]

## 2018-12-08 ENCOUNTER — Other Ambulatory Visit: Payer: Self-pay

## 2018-12-08 ENCOUNTER — Encounter: Payer: Self-pay | Admitting: Adult Health

## 2018-12-08 ENCOUNTER — Ambulatory Visit: Payer: 59 | Admitting: Adult Health

## 2018-12-08 VITALS — BP 120/72 | HR 63 | Ht 60.0 in | Wt 180.0 lb

## 2018-12-08 DIAGNOSIS — Z79899 Other long term (current) drug therapy: Secondary | ICD-10-CM | POA: Diagnosis not present

## 2018-12-08 DIAGNOSIS — E7800 Pure hypercholesterolemia, unspecified: Secondary | ICD-10-CM

## 2018-12-08 DIAGNOSIS — R079 Chest pain, unspecified: Secondary | ICD-10-CM

## 2018-12-08 LAB — BASIC METABOLIC PANEL
BUN/Creatinine Ratio: 13 (ref 9–23)
BUN: 10 mg/dL (ref 6–24)
CALCIUM: 9.4 mg/dL (ref 8.7–10.2)
CHLORIDE: 104 mmol/L (ref 96–106)
CO2: 22 mmol/L (ref 20–29)
Creatinine, Ser: 0.76 mg/dL (ref 0.57–1.00)
GFR calc Af Amer: 104 mL/min/{1.73_m2} (ref >59)
GFR calc non Af Amer: 90 mL/min/{1.73_m2} (ref >59)
GLUCOSE: 94 mg/dL (ref 65–99)
POTASSIUM: 4.5 mmol/L (ref 3.5–5.2)
Sodium: 139 mmol/L (ref 134–144)

## 2018-12-08 MED ORDER — METOPROLOL TARTRATE 50 MG PO TABS: 50.0000 mg | ORAL_TABLET | Freq: Two times a day (BID) | ORAL | 0 refills | Status: DC

## 2018-12-08 MED ORDER — METOPROLOL TARTRATE 50 MG PO TABS: 50.0000 mg | ORAL_TABLET | Freq: Once | ORAL | 0 refills | Status: AC

## 2018-12-08 NOTE — Telephone Encounter (Signed)
Results released to [facility] w/comments

## 2018-12-08 NOTE — Telephone Encounter (Signed)
Rx(s) sent to pharmacy electronically.  

## 2018-12-08 NOTE — Patient Instructions (Addendum)
Follow-Up: You will need a follow up appointment in AFTER CT WITH Rollene Rotunda, MD Joni Reining, DNP, AACC or one of the following Advanced Practice Providers on your designated Care Team:  Joni Reining, DNP, AACC  Theodore Demark, PA-C   Please arrive at the [facility] main entrance of [facility] at   AM (30-45 minutes prior to test start time)  [facility] [address] [place], [place] [address] [phone]  Proceed to the [facility] Radiology Department (First Floor).  Please follow these instructions carefully (unless otherwise directed):  Hold all erectile dysfunction medications at least 48 hours prior to test.  On the Night Before the Test: . Be sure to Drink plenty of water. . Do not consume any caffeinated/decaffeinated beverages or chocolate 12 hours prior to your test. . Do not take any antihistamines 12 hours prior to your test. . If you take Metformin do not take 24 hours prior to test. . If the patient has contrast allergy: ? Patient will need a prescription for Prednisone and very clear instructions (as follows): 1. Prednisone 50 mg - take 13 hours prior to test 2. Take another Prednisone 50 mg 7 hours prior to test 3. Take another Prednisone 50 mg 1 hour prior to test 4. Take Benadryl 50 mg 1 hour prior to test . Patient must complete all four doses of above prophylactic medications. . Patient will need a ride after test due to Benadryl.  On the Day of the Test: . Drink plenty of water. Do not drink any water within one hour of the test. . Do not eat any food 4 hours prior to the test. . You may take your regular medications prior to the test.  . Take metoprolol (Lopressor) two hours prior to test. . HOLD Furosemide/Hydrochlorothiazide morning of the test. . HOLD YOUR ADDERAL THE MORNING OF THE TEST   *For Clinical Staff only. Please instruct patient the following:*        -Drink plenty of water       -Hold  Furosemide/hydrochlorothiazide, ADDERALL morning of the test       -Take metoprolol (Lopressor) 2 hours prior to test (if applicable).       After the Test: . Drink plenty of water. . After receiving IV contrast, you may experience a mild flushed feeling. This is normal. . On occasion, you may experience a mild rash up to 24 hours after the test. This is not dangerous. If this occurs, you can take Benadryl 25 mg and increase your fluid intake. . If you experience trouble breathing, this can be serious. If it is severe call 911 IMMEDIATELY. If it is mild, please call our office. . If you take any of these medications: Glipizide/Metformin, Avandament, Glucavance, please do not take 48 hours after completing test.

## 2018-12-11 DIAGNOSIS — J09X9 Influenza due to identified novel influenza A virus with other manifestations: Secondary | ICD-10-CM | POA: Diagnosis not present

## 2019-01-01 DIAGNOSIS — Z9884 Bariatric surgery status: Secondary | ICD-10-CM | POA: Diagnosis not present

## 2019-01-01 DIAGNOSIS — Z4651 Encounter for fitting and adjustment of gastric lap band: Secondary | ICD-10-CM | POA: Diagnosis not present

## 2019-01-07 ENCOUNTER — Telehealth: Payer: Self-pay

## 2019-01-07 NOTE — Telephone Encounter (Signed)
[id number] 

## 2019-01-08 NOTE — Telephone Encounter (Signed)
Pt has already cancelled appt

## 2019-01-12 ENCOUNTER — Ambulatory Visit: Payer: 59 | Admitting: Adult Health

## 2019-01-13 IMAGING — RF DG ESOPHAGUS
12 of 17 series · 14 of 24 positions shown · non-contrast
Comparison: Upper GI series 02/04/2013.

CLINICAL DATA: 52-year-old female with chronic gastric band.
Increased gastroesophageal reflux type symptoms recently but
otherwise asymptomatic.

EXAM:
ESOPHOGRAM/BARIUM SWALLOW
UPPER GI SERIES WITH KUB
TECHNIQUE: After obtaining a scout radiograph a combined double contrast and
single contrast esophagram and upper GI examination performed using
effervescent crystals, thick barium liquid, and thin barium liquid.
FLUOROSCOPY TIME:  Fluoroscopy Time:  2 minutes 18 seconds
Radiation Exposure Index (if provided by the fluoroscopic device):
36.3 mGy
Number of Acquired Spot Images: 0

[Series 1: fluoro_barium 2fps_bw · 0.17mm/px · 1 of 1 slices shown]
[im 1/1]
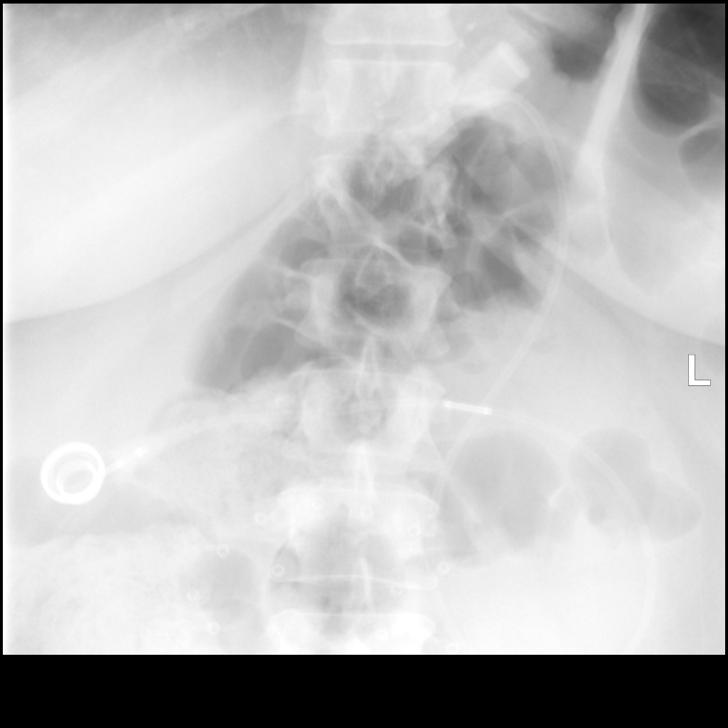

[Series 2: cp_standard · 0.36mm/px · 1 of 41 frames shown (1 of 11)]
[frame 35/41]
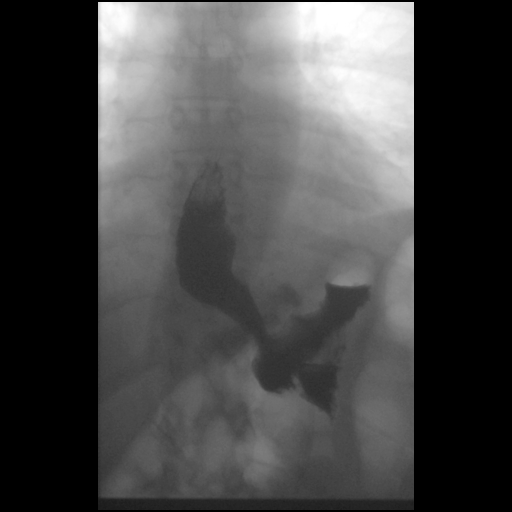

[Series 3: cp_standard · 0.36mm/px · 1 of 41 frames shown (2 of 11)]
[frame 35/41]
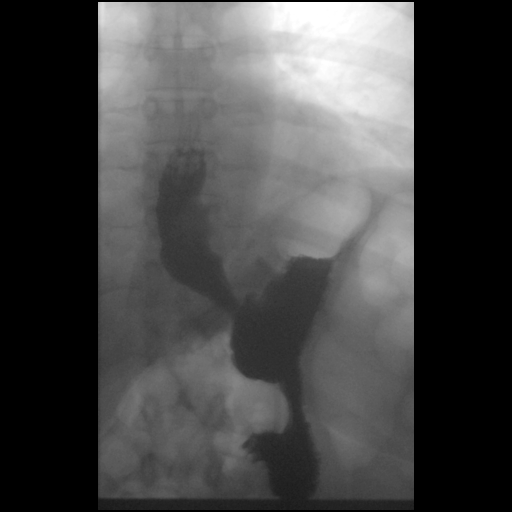

[Series 6: cp_standard · 0.19mm/px · 1 of 1 slices shown (3 of 11)]
[im 1/1]
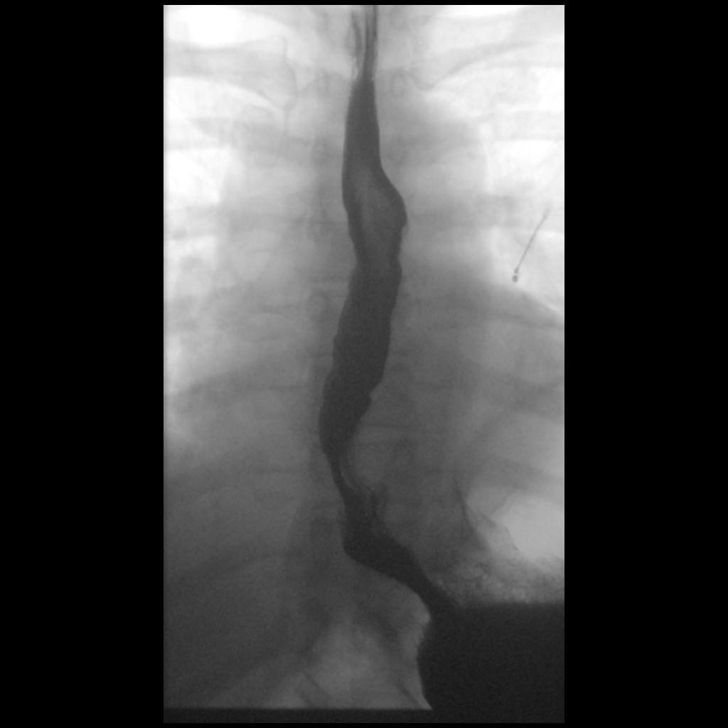

[Series 8: cp_standard · 0.19mm/px · 1 of 1 slices shown (4 of 11)]
[im 1/1]
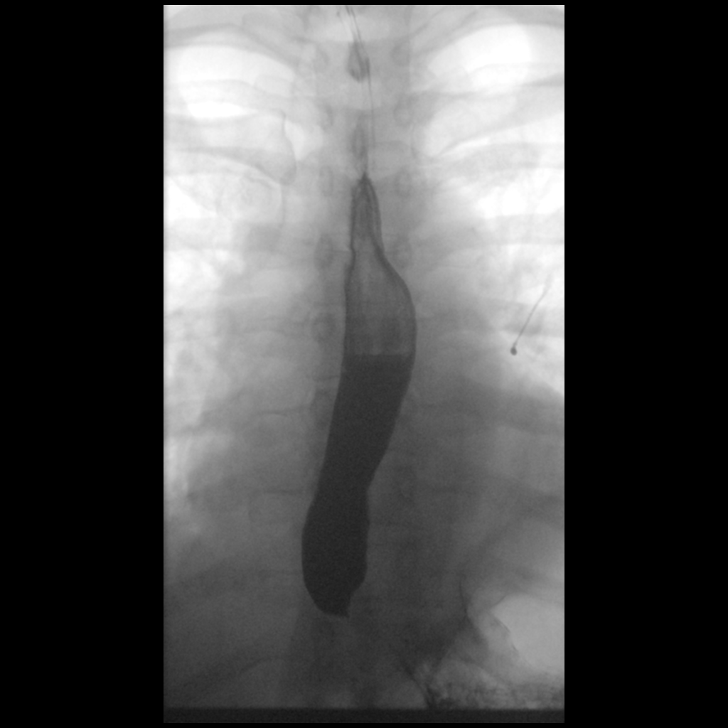

[Series 12: cp_standard · 0.37mm/px · 2 of 18 frames shown (5 of 11)]
[frame 3/18]
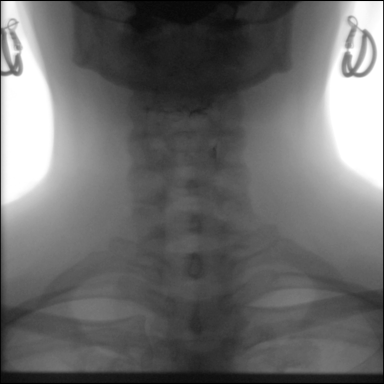
[frame 17/18]
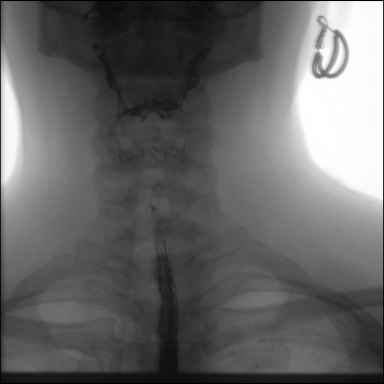

[Series 13: cp_standard · 0.35mm/px · 1 of 23 frames shown (6 of 11)]
[frame 12/23]
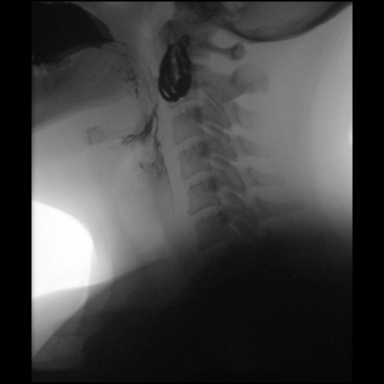

[Series 14: cp_standard · 0.18mm/px · 1 of 1 slices shown (7 of 11)]
[im 1/1]
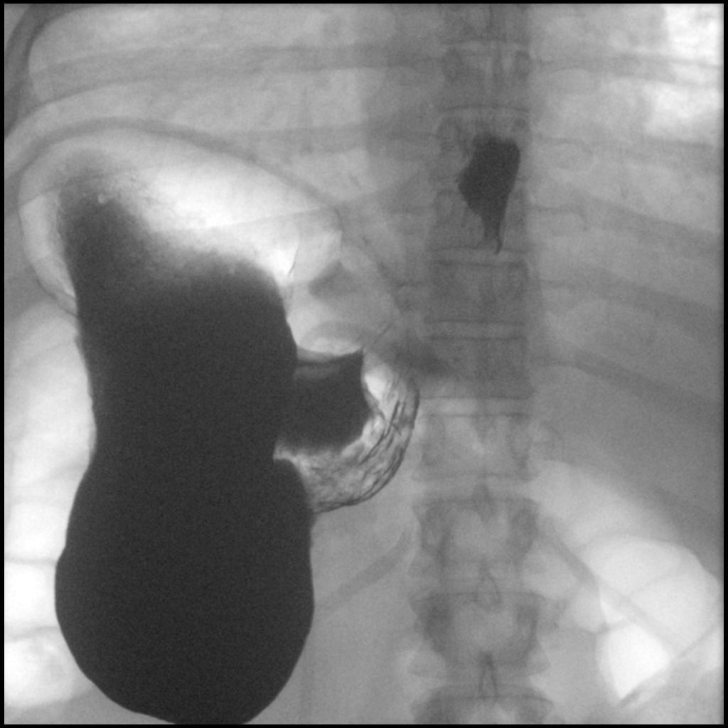

[Series 16: cp_standard · 0.36mm/px · 1 of 29 frames shown (8 of 11)]
[frame 15/29]
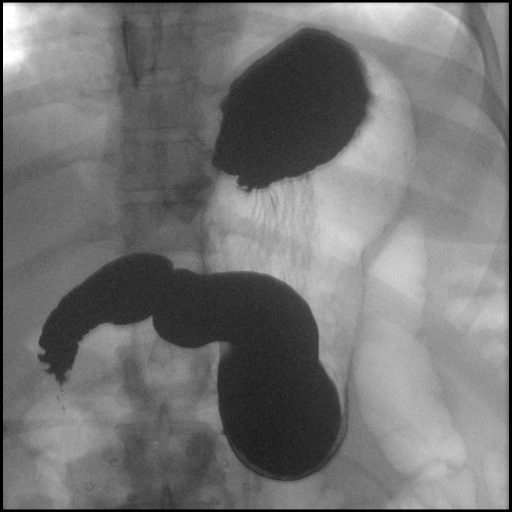

[Series 18: cp_standard · 0.36mm/px · 2 of 47 frames shown (9 of 11)]
[frame 1/47]
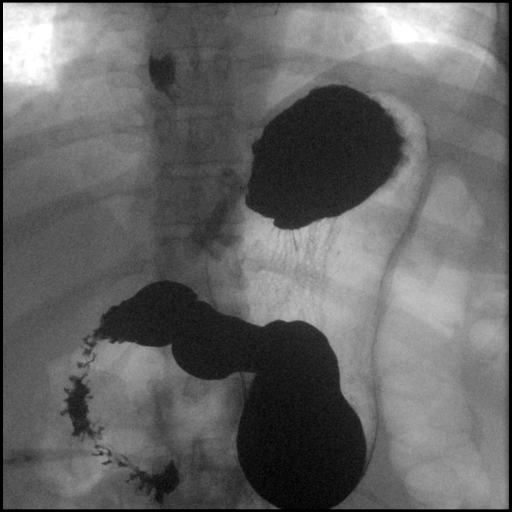
[frame 24/47]
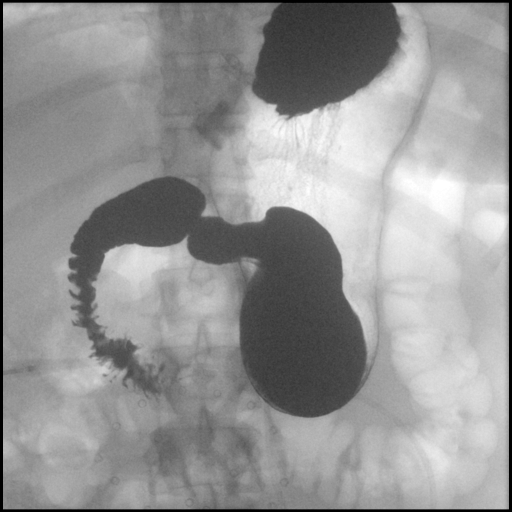

[Series 21: cp_standard · 0.18mm/px · 1 of 1 slices shown (10 of 11)]
[im 1/1]
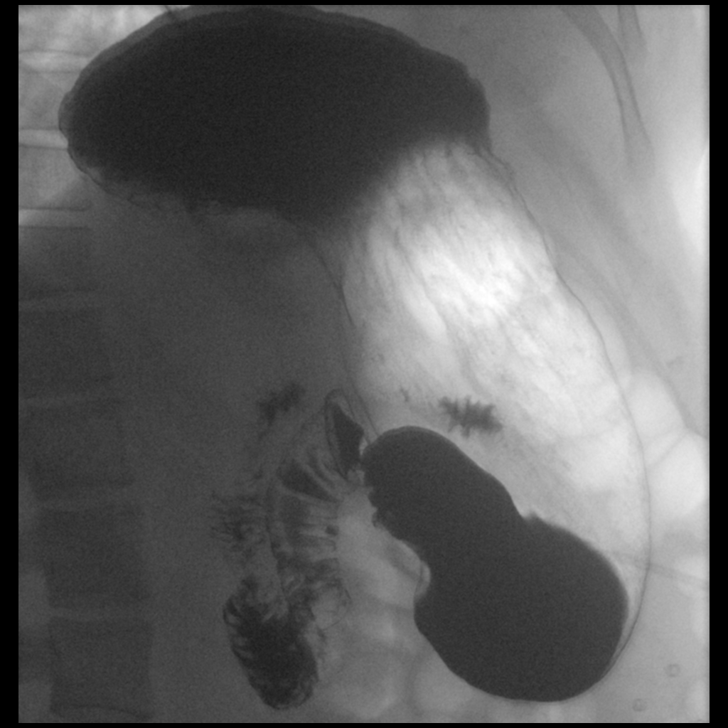

[Series 24: cp_standard · 0.18mm/px · 1 of 1 slices shown (11 of 11)]
[im 1/1]
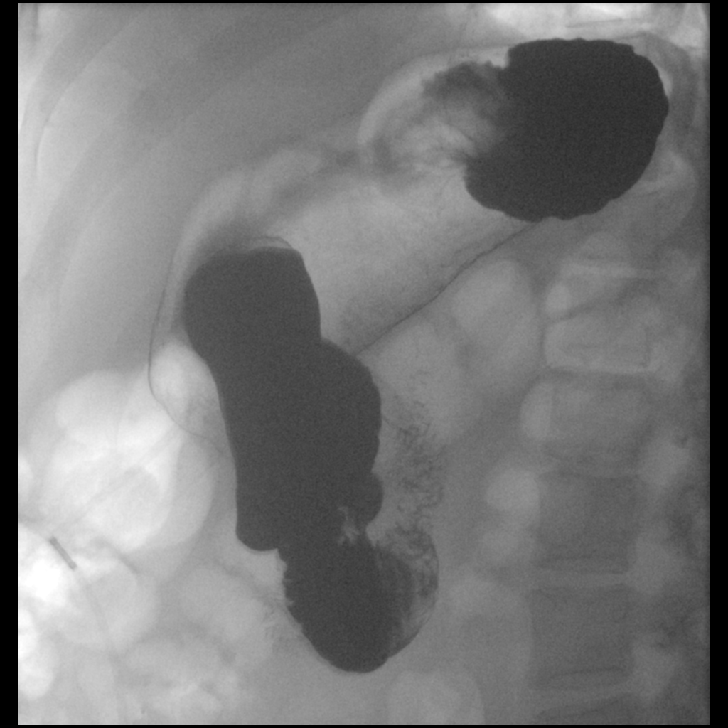

[14 of 24 positions shown; findings below may reference images not displayed]

FINDINGS: Preprocedural scout view of the abdomen demonstrates stable lap band
orientation since [DATE] o'clock to 8 o'clock.

After initial single-contrast barium swallows were well-tolerated a
double contrast study was undertaken and the patient tolerated this
without difficulty.

No obstruction to the forward flow of contrast throughout the
esophagus and into the stomach. Normal esophageal course and
contour.

Occasional tertiary contractions occurred in the mid and distal
esophagus (series 7 and 10). Visible esophageal mucosal pattern is
normal.

Dedicated cervical esophagus imaging is normal.

Normal gastroesophageal junction. Just distal to the GEJ the lap
band is in place and contrast passage through the band is without
delay and stable to that in 5664.

With the patient supine a trace amount of gastroesophageal reflux
was observed (series 17). But no additional gastroesophageal reflux
occurred spontaneously or could be elicited.

Normal gastric contour. Prompt gastric emptying. Normal duodenum
bulb and proximal duodenum. There is a 15 millimeter diverticulum in
the 2nd portion of the duodenum (series 27). Otherwise normal
duodenum course and contour. Normal ligament of Treitz. Opacified
proximal jejunum loops appear normal.
IMPRESSION: 1. Unchanged configuration of and passage of contrast through the
gastric lap band since the 5664 UGI.
2. Mild presbyesophagus; occasional tertiary contractions observed.
3. Otherwise normal esophagram.
4. Trace gastroesophageal reflux occurred when supine.
5. Small 15 mm diverticulum in the 2nd portion of the duodenum,
probably inconsequential.
6. Otherwise normal upper GI aside.

## 2020-08-15 ENCOUNTER — Other Ambulatory Visit: Payer: Self-pay | Admitting: Physician Assistant

## 2020-08-15 DIAGNOSIS — Z0000 Encounter for general adult medical examination without abnormal findings: Secondary | ICD-10-CM

## 2020-09-21 ENCOUNTER — Other Ambulatory Visit: Payer: Self-pay

## 2020-09-21 ENCOUNTER — Ambulatory Visit
Admission: RE | Admit: 2020-09-21 | Discharge: 2020-09-21 | Disposition: A | Discharge status: Home / Self Care | Payer: 59 | Source: Ambulatory Visit | Attending: Physician Assistant | Admitting: Physician Assistant

## 2020-09-21 DIAGNOSIS — Z0000 Encounter for general adult medical examination without abnormal findings: Secondary | ICD-10-CM

## 2021-05-08 ENCOUNTER — Other Ambulatory Visit: Payer: Self-pay | Admitting: Student

## 2021-05-08 DIAGNOSIS — K219 Gastro-esophageal reflux disease without esophagitis: Secondary | ICD-10-CM

## 2021-05-11 ENCOUNTER — Ambulatory Visit
Admission: RE | Admit: 2021-05-11 | Discharge: 2021-05-11 | Disposition: A | Discharge status: Home / Self Care | Payer: 59 | Source: Ambulatory Visit | Attending: Student | Admitting: Student

## 2021-05-11 ENCOUNTER — Other Ambulatory Visit: Payer: Self-pay | Admitting: Student

## 2021-05-11 DIAGNOSIS — K219 Gastro-esophageal reflux disease without esophagitis: Secondary | ICD-10-CM

## 2021-11-07 ENCOUNTER — Other Ambulatory Visit: Payer: Self-pay | Admitting: Surgery

## 2021-11-07 DIAGNOSIS — R1033 Periumbilical pain: Secondary | ICD-10-CM

## 2021-11-29 ENCOUNTER — Inpatient Hospital Stay: Admission: RE | Admit: 2021-11-29 | Payer: 59 | Source: Ambulatory Visit

## 2022-01-15 ENCOUNTER — Ambulatory Visit
Admission: RE | Admit: 2022-01-15 | Discharge: 2022-01-15 | Disposition: A | Discharge status: Home / Self Care | Payer: 59 | Source: Ambulatory Visit | Attending: Surgery | Admitting: Surgery

## 2022-01-15 ENCOUNTER — Other Ambulatory Visit: Payer: Self-pay

## 2022-01-15 DIAGNOSIS — R1033 Periumbilical pain: Secondary | ICD-10-CM

## 2022-01-15 MED ORDER — IOPAMIDOL (ISOVUE-300) INJECTION 61%
100.0000 mL | Freq: Once | INTRAVENOUS | Status: AC | PRN
  Administered 2022-01-15: 100 mL via INTRAVENOUS

## 2022-01-30 ENCOUNTER — Encounter: Payer: Self-pay | Admitting: Internal Medicine

## 2022-01-30 ENCOUNTER — Ambulatory Visit: Payer: 59 | Admitting: Internal Medicine

## 2022-01-30 VITALS — BP 120/90 | HR 58 | Ht 60.0 in | Wt 183.0 lb

## 2022-01-30 DIAGNOSIS — R079 Chest pain, unspecified: Secondary | ICD-10-CM

## 2022-01-30 NOTE — Patient Instructions (Signed)
Medication Instructions:  ?No Changes In Medications at this time.  ?*If you need a refill on your cardiac medications before your next appointment, please call your pharmacy* ? ?Testing/Procedures: ?Your physician has requested that you have an echocardiogram. Echocardiography is a painless test that uses sound waves to create images of your heart. It provides your doctor with information about the size and shape of your heart and how well your heart?s chambers and valves are working. You may receive an ultrasound enhancing agent through an IV if needed to better visualize your heart during the echo.This procedure takes approximately one hour. There are no restrictions for this procedure. This will take place at the [address], [address].  ? ?Follow-Up: ?At [facility], you and your health needs are our priority.  As part of our continuing mission to provide you with exceptional heart care, we have created designated Provider Care Teams.  These Care Teams include your primary Cardiologist (physician) and Advanced Practice Providers (APPs -  Physician Assistants and Nurse Practitioners) who all work together to provide you with the care you need, when you need it. ? ?Your next appointment:   ?AS NEEDED  ? ?The format for your next appointment:   ?In Person ? ?Provider:   ?Maisie Fus, MD   ? ?Important Information About Sugar ? ? ? ? ?  ?

## 2022-01-30 NOTE — Progress Notes (Signed)
?Cardiology Office Note:   ? ?Date:  01/30/2022  ? ?ID:  Connie [name], DOB September 27, 1966, MRN [id number] ? ?PCP:  Lennie Odor, PA ?  ?[facility] Providers ?Cardiologist:  Janina Mayo, MD    ? ?Referring MD: Lennie Odor, PA  ? ?No chief complaint on file. ?Re-establish care ? ?History of Present Illness:   ? ?Connie [name] is a 56 y.o. female with a hx of thyroid dx, hld, referral to re-establish care ? ?Former pt of Dr. Percival Spanish. She had chest /jaw pain. She underwent a myoview and echo which was normal.  He discussed with her that her chest pain was non cardiac. She has no cardiac disease. She remained anxious due to her friend her had a stent. She was last seen by Jory Sims who works with Dr. Percival Spanish , last seen in 2020.  ? ?She notes she still has the pinching feeling in her chest. She is very active and has animals. She can work with her kids. She feels some short of breath with activity since early January. It has not progressed. She's had a cough. Has has allergies. She feels more fatigue with exercise. No swelling in her legs. No orthopnea or PND. No recent hospitalizations.  ? ?TSH is normal. ? ?Non Smoker ? ?No family hx of premature CAD. ? ?EKG 11/19/2018- sinus bradycardia HR 52 BPM ? ?Past Medical History:  ?Diagnosis Date  ? Hyperlipidemia   ? Thyroid disease   ? ? ?Past Surgical History:  ?Procedure Laterality Date  ? ABDOMINAL HYSTERECTOMY    ? BREAST SURGERY    ? REDUCTION  ? CESAREAN SECTION    ? HERNIA REPAIR    ? LAPAROSCOPIC OOPHERECTOMY Left   ? LAPBAND    ? REDUCTION MAMMAPLASTY Bilateral   ? ? ?Current Medications: ?Current Meds  ?Medication Sig  ? amphetamine-dextroamphetamine (ADDERALL) 10 MG tablet TAKE 1 TABLET BY MOUTH EVERY MORNING  ? atorvastatin (LIPITOR) 80 MG tablet TAKE 1 TABLET BY MOUTH EVERY DAY  ? azelastine (ASTELIN) 0.1 % nasal spray daily in the afternoon.  ? Calcium Polycarbophil (FIBER-CAPS PO) Take 1 tablet by mouth daily.  ? levothyroxine (SYNTHROID) 112 MCG  tablet Take 112 mcg by mouth daily.    ? oxybutynin (DITROPAN) 5 MG tablet Take 5 mg by mouth every evening.  ? Vitamin D, Ergocalciferol, (DRISDOL) 1.25 MG (50000 UT) CAPS capsule Take 50,000 Units by mouth every 7 (seven) days.  ?  ? ?Allergies:   Codeine, Fentanyl, Other, Tetanus-diphth-acell pertussis [tetanus-diphth-acell pertussis], Demerol, Pork-derived products, and Poractant alfa  ? ?Social History  ? ?Socioeconomic History  ? Marital status: Married  ?  Spouse name: Not on file  ? Number of children: Not on file  ? Years of education: Not on file  ? Highest education level: Not on file  ?Occupational History  ? Occupation: [employer]  ?Tobacco Use  ? Smoking status: Never  ? Smokeless tobacco: Never  ?Substance and Sexual Activity  ? Alcohol use: Yes  ?  Comment: Occasional drink about 1 time a month  ? Drug use: No  ? Sexual activity: Yes  ?Other Topics Concern  ? Not on file  ?Social History Narrative  ? Lives at home with husband.  Two children and two grands.    ? ?Social Determinants of Health  ? ?Financial Resource Strain: Not on file  ?Food Insecurity: Not on file  ?Transportation Needs: Not on file  ?Physical Activity: Not on file  ?Stress:  Not on file  ?Social Connections: Not on file  ?  ? ?Family History: ?The patient's family history includes Hypertension in her father and mother; Ovarian cancer in her paternal grandmother. ? ?ROS:   ?Please see the history of present illness.    ? All other systems reviewed and are negative. ? ?EKGs/Labs/Other Studies Reviewed:   ? ?The following studies were reviewed today: ? ? ?EKG:  EKG is  ordered today.  The ekg ordered today demonstrates  ? ?EKG 01/30/2022-Sinus bradycardia  ? ?Recent Labs: ?No results found for requested labs within last 8760 hours.  ?Recent Lipid Panel ?No results found for: CHOL, TRIG, HDL, CHOLHDL, VLDL, LDLCALC, LDLDIRECT ? ? ?Risk Assessment/Calculations:   ?  ? ?    ? ?Physical Exam:   ? ?VS:  ? ?Today's  Vitals  ? 01/30/22 1303  ?BP: 120/90  ?Pulse: (!) 58  ?SpO2: 96%  ?Weight: 183 lb (83 kg)  ?Height: 5' (1.524 m)  ?PainSc: 0-No pain  ? ?Body mass index is 35.74 kg/m?. ? ? ? ?Wt Readings from Last 3 Encounters:  ?01/30/22 183 lb (83 kg)  ?12/08/18 180 lb (81.6 kg)  ?11/27/18 178 lb (80.7 kg)  ?  ? ?GEN:  Well nourished, well developed in no acute distress ?HEENT: Normal ?NECK: No JVD; No carotid bruits ?LYMPHATICS: No lymphadenopathy ?CARDIAC: RRR, no murmurs, rubs, gallops ?RESPIRATORY:  Clear to auscultation without rales, wheezing or rhonchi  ?ABDOMEN: Soft, non-tender, non-distended ?MUSCULOSKELETAL:  No edema; No deformity  ?SKIN: Warm and dry ?NEUROLOGIC:  Alert and oriented x 3 ?PSYCHIATRIC:  Normal affect  ? ?ASSESSMENT:   ? ?SOB: She reports increased SOB. Can conduct her physical activity without limitations. She noted similar symptoms 3 years ago. She was reassured however had consistent concern do to a family member with a PCI. She endorsed similar concern today. Discussed we can do an echo to assess WMA, PASP. ? ?HLD- Calc LDL 75 mg/dL. continue atorvastatin 80 mg daily ? ?HTN- Well controlled continue metop tartrate 50 mg BID ? ? ?PLAN:   ? ?In order of problems listed above: ? ?TTE ?If echo normal, she does not need to follow with cardiology at this time ? ?   ?   ?Medication Adjustments/Labs and Tests Ordered: ?Current medicines are reviewed at length with the patient today.  Concerns regarding medicines are outlined above.  ?Orders Placed This Encounter  ?Procedures  ? EKG 12-Lead  ? ECHOCARDIOGRAM COMPLETE  ? ?No orders of the defined types were placed in this encounter. ? ? ?Patient Instructions  ?Medication Instructions:  ?No Changes In Medications at this time.  ?*If you need a refill on your cardiac medications before your next appointment, please call your pharmacy* ? ?Testing/Procedures: ?Your physician has requested that you have an echocardiogram. Echocardiography is a painless test that  uses sound waves to create images of your heart. It provides your doctor with information about the size and shape of your heart and how well your heart?s chambers and valves are working. You may receive an ultrasound enhancing agent through an IV if needed to better visualize your heart during the echo.This procedure takes approximately one hour. There are no restrictions for this procedure. This will take place at the [address], [address].  ? ?Follow-Up: ?At [facility], you and your health needs are our priority.  As part of our continuing mission to provide you with exceptional heart care, we have created designated Provider Care Teams.  These Care  Teams include your primary Cardiologist (physician) and Advanced Practice Providers (APPs -  Physician Assistants and Nurse Practitioners) who all work together to provide you with the care you need, when you need it. ? ?Your next appointment:   ?AS NEEDED  ? ?The format for your next appointment:   ?In Person ? ?Provider:   ?Janina Mayo, MD   ? ?Important Information About Sugar ? ? ? ? ?   ? ?Signed, ?Janina Mayo, MD  ?01/30/2022 1:36 PM    ?[facility] ?

## 2022-02-12 ENCOUNTER — Telehealth (HOSPITAL_COMMUNITY): Payer: Self-pay | Admitting: Internal Medicine

## 2022-02-12 NOTE — Telephone Encounter (Signed)
Patient called and cancelled echocardiogram and will call at a later date to reschedule. Order will be removed from the echo WQ. If pt calls back to reschedule we will reinstate the order.  ?

## 2022-02-13 ENCOUNTER — Other Ambulatory Visit (HOSPITAL_COMMUNITY): Payer: 59

## 2022-03-11 ENCOUNTER — Emergency Department (HOSPITAL_BASED_OUTPATIENT_CLINIC_OR_DEPARTMENT_OTHER): Payer: 59

## 2022-03-11 ENCOUNTER — Other Ambulatory Visit: Payer: Self-pay

## 2022-03-11 ENCOUNTER — Encounter (HOSPITAL_BASED_OUTPATIENT_CLINIC_OR_DEPARTMENT_OTHER): Payer: Self-pay | Admitting: Emergency Medicine

## 2022-03-11 ENCOUNTER — Emergency Department (HOSPITAL_BASED_OUTPATIENT_CLINIC_OR_DEPARTMENT_OTHER)
Admission: EM | Admit: 2022-03-11 | Discharge: 2022-03-11 | Disposition: A | Discharge status: Home / Self Care | Payer: 59 | Attending: Emergency Medicine | Admitting: Emergency Medicine

## 2022-03-11 DIAGNOSIS — K2100 Gastro-esophageal reflux disease with esophagitis, without bleeding: Secondary | ICD-10-CM | POA: Diagnosis not present

## 2022-03-11 DIAGNOSIS — R0789 Other chest pain: Secondary | ICD-10-CM | POA: Diagnosis present

## 2022-03-11 DIAGNOSIS — K219 Gastro-esophageal reflux disease without esophagitis: Secondary | ICD-10-CM

## 2022-03-11 DIAGNOSIS — Z79899 Other long term (current) drug therapy: Secondary | ICD-10-CM | POA: Diagnosis not present

## 2022-03-11 LAB — BASIC METABOLIC PANEL
Anion gap: 9 (ref 5–15)
BUN: 15 mg/dL (ref 6–20)
CO2: 31 mmol/L (ref 22–32)
Calcium: 9.3 mg/dL (ref 8.9–10.3)
Chloride: 104 mmol/L (ref 98–111)
Creatinine, Ser: 0.76 mg/dL (ref 0.44–1.00)
GFR, Estimated: >60 mL/min (ref >60)
Glucose, Bld: 101 mg/dL — ABNORMAL HIGH (ref 70–99)
Potassium: 3.4 mmol/L — ABNORMAL LOW (ref 3.5–5.1)
Sodium: 144 mmol/L (ref 135–145)

## 2022-03-11 LAB — CBC
HCT: 40.3 % (ref 36.0–46.0)
Hemoglobin: 13.2 g/dL (ref 12.0–15.0)
MCH: 29.3 pg (ref 26.0–34.0)
MCHC: 32.8 g/dL (ref 30.0–36.0)
MCV: 89.4 fL (ref 80.0–100.0)
Platelets: 316 10*3/uL (ref 150–400)
RBC: 4.51 MIL/uL (ref 3.87–5.11)
RDW: 14.3 % (ref 11.5–15.5)
WBC: 10 10*3/uL (ref 4.0–10.5)
nRBC: 0 % (ref 0.0–0.2)

## 2022-03-11 LAB — TROPONIN I (HIGH SENSITIVITY)
Troponin I (High Sensitivity): 4 ng/L (ref <18)
Troponin I (High Sensitivity): 4 ng/L (ref <18)

## 2022-03-11 LAB — LIPASE, BLOOD: Lipase: 21 U/L (ref 11–51)

## 2022-03-11 MED ORDER — LIDOCAINE VISCOUS HCL 2 % MT SOLN
15.0000 mL | Freq: Once | OROMUCOSAL | Status: AC
Start: 2022-03-11 — End: 2022-03-11
  Administered 2022-03-11: 15 mL via ORAL
  Filled 2022-03-11: qty 15

## 2022-03-11 MED ORDER — POTASSIUM CHLORIDE CRYS ER 20 MEQ PO TBCR
40.0000 meq | EXTENDED_RELEASE_TABLET | Freq: Once | ORAL | Status: AC
Start: 2022-03-11 — End: 2022-03-11
  Administered 2022-03-11: 40 meq via ORAL
  Filled 2022-03-11: qty 2

## 2022-03-11 MED ORDER — SUCRALFATE 1 GM/10ML PO SUSP: 1.0000 g | Freq: Three times a day (TID) | ORAL | 0 refills | Status: AC

## 2022-03-11 MED ORDER — SUCRALFATE 1 GM/10ML PO SUSP
1.0000 g | Freq: Once | ORAL | Status: AC
  Administered 2022-03-11: 1 g via ORAL
  Filled 2022-03-11: qty 10

## 2022-03-11 MED ORDER — ALUM & MAG HYDROXIDE-SIMETH 200-200-20 MG/5ML PO SUSP
30.0000 mL | Freq: Once | ORAL | Status: AC
  Administered 2022-03-11: 30 mL via ORAL
  Filled 2022-03-11: qty 30

## 2022-03-11 NOTE — ED Provider Notes (Signed)
[facility] EMERGENCY DEPT Provider Note   CSN: [id number] Arrival date & time: 03/11/22  1753     History  Chief Complaint  Patient presents with   Heartburn   Chest Pain    Connie [name] is a 56 y.o. female.  HPI 56 year old female presents with chest pressure/burning.  She has a history of reflux and this feels similar.  Is been ongoing for about a week and mostly is worse at night.  She can take some soda water and it typically goes away.  Recurred again this morning.  However this afternoon while she was holding her grandbaby she had a recurrence of the pain and it felt worse.  This was approximately at 430 or 5 PM.  It was the same type of pain but stronger.  No shortness of breath, diaphoresis, radiation of the pain, back or abdominal pain or vomiting.  Took some soda water and Tums with only partial relief.  At the time I am talking to her, the pain has resolved though she has a feeling like it might come back.  She used to be on Prilosec but took herself off as she has not had any issues for about 8 months.  No leg swelling.  She denies a history of hypertension, diabetes, smoking, or family history of heart disease  Home Medications Prior to Admission medications   Medication Sig Start Date End Date Taking? Authorizing Provider  sucralfate (CARAFATE) 1 GM/10ML suspension Take 10 mLs (1 g total) by mouth 4 (four) times daily -  with meals and at bedtime. 03/11/22  Yes Pricilla Loveless, MD  amphetamine-dextroamphetamine (ADDERALL) 10 MG tablet TAKE 1 TABLET BY MOUTH EVERY MORNING 04/01/16   [provider]  atorvastatin (LIPITOR) 80 MG tablet TAKE 1 TABLET BY MOUTH EVERY DAY 04/12/16   [provider]  azelastine (ASTELIN) 0.1 % nasal spray daily in the afternoon. 01/10/22   [provider]  Calcium Polycarbophil (FIBER-CAPS PO) Take 1 tablet by mouth daily.    [provider]  levothyroxine (SYNTHROID) 112 MCG tablet Take 112 mcg by  mouth daily.      [provider]  loratadine (CLARITIN) 10 MG tablet Take 10 mg by mouth daily as needed for allergies. Patient not taking: Reported on 01/30/2022    [provider]  metoprolol tartrate (LOPRESSOR) 50 MG tablet Take 1 tablet (50 mg total) by mouth once for 1 dose. 2 hours prior to procedure Patient not taking: Reported on 01/30/2022 12/08/18 12/08/18  Jodelle Gross, NP  omeprazole (PRILOSEC) 20 MG capsule Take 20 mg by mouth daily as needed. Patient not taking: Reported on 01/30/2022 05/31/17   [provider]  oxybutynin (DITROPAN) 5 MG tablet Take 5 mg by mouth every evening. 08/02/17   [provider]  Vitamin D, Ergocalciferol, (DRISDOL) 1.25 MG (50000 UT) CAPS capsule Take 50,000 Units by mouth every 7 (seven) days. 11/25/18   [provider]      Allergies    Codeine, Fentanyl, Other, Tetanus-diphth-acell pertussis [tetanus-diphth-acell pertussis], Demerol, Pork-derived products, and Poractant alfa    Review of Systems   Review of Systems  Constitutional:  Negative for diaphoresis.  Respiratory:  Negative for shortness of breath.   Cardiovascular:  Positive for chest pain.  Gastrointestinal:  Negative for abdominal pain.  Musculoskeletal:  Negative for back pain.   Physical Exam Updated Vital Signs BP 113/68   Pulse 71   Temp 98.7 F (37.1 C)   Resp 18  SpO2 96%  Physical Exam Vitals and nursing note reviewed.  Constitutional:      Appearance: She is well-developed.  HENT:     Head: Normocephalic and atraumatic.  Cardiovascular:     Rate and Rhythm: Normal rate and regular rhythm.     Heart sounds: Normal heart sounds.  Pulmonary:     Effort: Pulmonary effort is normal.     Breath sounds: Normal breath sounds.  Abdominal:     Palpations: Abdomen is soft.     Tenderness: There is no abdominal tenderness.  Musculoskeletal:     Right lower leg: No edema.     Left lower leg: No edema.  Skin:    General:  Skin is warm and dry.  Neurological:     Mental Status: She is alert.    ED Results / Procedures / Treatments   Labs (all labs ordered are listed, but only abnormal results are displayed) Labs Reviewed  BASIC METABOLIC PANEL - Abnormal; Notable for the following components:      Result Value   Potassium 3.4 (*)    Glucose, Bld 101 (*)    All other components within normal limits  CBC  LIPASE, BLOOD  TROPONIN I (HIGH SENSITIVITY)  TROPONIN I (HIGH SENSITIVITY)    EKG EKG Interpretation  Date/Time:  Sunday Mar 11 2022 21:07:07 EDT Ventricular Rate:  52 PR Interval:  157 QRS Duration: 131 QT Interval:  423 QTC Calculation: 394 R Axis:   -8 Text Interpretation: Sinus rhythm LVH with IVCD and secondary repol abnrm  similar nonspecific T waves compared to earlier in the day Confirmed by Pricilla LovelessGoldston, Montay Vanvoorhis [phone]) on 03/11/2022 10:07:54 PM  Radiology DG Chest Port 1 View  Result Date: 03/11/2022 CLINICAL DATA:  Chest pressure, heartburn EXAM: PORTABLE CHEST 1 VIEW COMPARISON:  None Available. FINDINGS: The heart size and mediastinal contours are within normal limits. Both lungs are clear. The visualized skeletal structures are unremarkable. IMPRESSION: No active disease. Electronically Signed   By: Charlett NoseKevin  Dover M.D.   On: 03/11/2022 19:49    Procedures Procedures    Medications Ordered in ED Medications  potassium chloride SA (KLOR-CON M) CR tablet 40 mEq (40 mEq Oral Given 03/11/22 2234)  alum & mag hydroxide-simeth (MAALOX/MYLANTA) 200-200-20 MG/5ML suspension 30 mL (30 mLs Oral Given 03/11/22 2101)    And  lidocaine (XYLOCAINE) 2 % viscous mouth solution 15 mL (15 mLs Oral Given 03/11/22 2101)  sucralfate (CARAFATE) 1 GM/10ML suspension 1 g (1 g Oral Given 03/11/22 2235)    ED Course/ Medical Decision Making/ A&P                           Medical Decision Making Amount and/or Complexity of Data Reviewed External Data Reviewed: notes. Labs: ordered. Radiology: ordered and  independent interpretation performed. ECG/medicine tests: ordered and independent interpretation performed.  Risk OTC drugs. Prescription drug management.   Presentation sounds like GERD.  While she is having some chest pressure, this is similar to many prior GERD symptoms.  Troponins are negative x2 and ECG shows some nonspecific T waves but no dynamic changes on multiple EKGs in the emergency department.  Slight hypokalemia.  Otherwise labs have been interpreted and are normal.  Chest x-ray images viewed by myself and are also unremarkable including no pneumothorax.  At this point, low suspicion for ACS, PE, dissection.  She was given GI cocktail and Carafate.  She has been advised to restart her Prilosec and  I will also give Carafate prescription.  Discharged home with return precautions.        Final Clinical Impression(s) / ED Diagnoses Final diagnoses:  Gastroesophageal reflux disease, unspecified whether esophagitis present    Rx / DC Orders ED Discharge Orders          Ordered    sucralfate (CARAFATE) 1 GM/10ML suspension  3 times daily with meals & bedtime        03/11/22 2218              Pricilla Loveless, MD 03/11/22 2306

## 2022-03-11 NOTE — ED Triage Notes (Signed)
Pt having heartburn for few days, able to treat at home. But today started having pressure on her chest and has not went away, still has heartburn as well.

## 2022-03-11 NOTE — ED Notes (Signed)
Pt verbalizes understanding of discharge instructions. Opportunity for questioning and answers were provided. Pt discharged from ED to home.   ? ?

## 2022-03-11 NOTE — Discharge Instructions (Addendum)
If you develop recurrent, continued, or worsening chest pain, shortness of breath, fever, vomiting, abdominal or back pain, or any other new/concerning symptoms then return to the ER for evaluation.  

## 2022-05-31 ENCOUNTER — Other Ambulatory Visit: Payer: Self-pay | Admitting: Physician Assistant

## 2022-05-31 DIAGNOSIS — R93429 Abnormal radiologic findings on diagnostic imaging of unspecified kidney: Secondary | ICD-10-CM

## 2022-06-12 ENCOUNTER — Ambulatory Visit
Admission: RE | Admit: 2022-06-12 | Discharge: 2022-06-12 | Disposition: A | Discharge status: Home / Self Care | Payer: 59 | Source: Ambulatory Visit | Attending: Physician Assistant | Admitting: Physician Assistant

## 2022-06-12 DIAGNOSIS — R93429 Abnormal radiologic findings on diagnostic imaging of unspecified kidney: Secondary | ICD-10-CM

## 2022-06-12 MED ORDER — GADOBENATE DIMEGLUMINE 529 MG/ML IV SOLN
16.0000 mL | Freq: Once | INTRAVENOUS | Status: AC | PRN
Start: 2022-06-12 — End: 2022-06-12
  Administered 2022-06-12: 16 mL via INTRAVENOUS

## 2022-09-12 ENCOUNTER — Other Ambulatory Visit: Payer: Self-pay | Admitting: Physician Assistant

## 2022-09-12 DIAGNOSIS — Z1231 Encounter for screening mammogram for malignant neoplasm of breast: Secondary | ICD-10-CM

## 2022-09-17 ENCOUNTER — Other Ambulatory Visit: Payer: Self-pay | Admitting: Physician Assistant

## 2022-09-17 DIAGNOSIS — N6320 Unspecified lump in the left breast, unspecified quadrant: Secondary | ICD-10-CM

## 2022-09-25 ENCOUNTER — Ambulatory Visit
Admission: RE | Admit: 2022-09-25 | Discharge: 2022-09-25 | Disposition: A | Discharge status: Home / Self Care | Payer: 59 | Source: Ambulatory Visit | Attending: Physician Assistant | Admitting: Physician Assistant

## 2022-09-25 DIAGNOSIS — N6320 Unspecified lump in the left breast, unspecified quadrant: Secondary | ICD-10-CM

## 2022-10-01 ENCOUNTER — Other Ambulatory Visit: Payer: 59

## 2022-10-03 ENCOUNTER — Other Ambulatory Visit: Payer: Self-pay | Admitting: Physician Assistant

## 2022-10-03 DIAGNOSIS — R519 Headache, unspecified: Secondary | ICD-10-CM

## 2022-10-08 ENCOUNTER — Ambulatory Visit
Admission: RE | Admit: 2022-10-08 | Discharge: 2022-10-08 | Disposition: A | Discharge status: Home / Self Care | Payer: 59 | Source: Ambulatory Visit | Attending: Physician Assistant | Admitting: Physician Assistant

## 2022-10-08 DIAGNOSIS — R519 Headache, unspecified: Secondary | ICD-10-CM

## 2022-10-31 ENCOUNTER — Encounter: Payer: Self-pay | Admitting: Neurology

## 2022-11-28 ENCOUNTER — Ambulatory Visit: Payer: 59 | Admitting: Neurology

## 2023-11-29 ENCOUNTER — Other Ambulatory Visit: Payer: Self-pay | Admitting: Physician Assistant

## 2023-11-29 DIAGNOSIS — Z1231 Encounter for screening mammogram for malignant neoplasm of breast: Secondary | ICD-10-CM

## 2023-12-10 ENCOUNTER — Ambulatory Visit
Admission: RE | Admit: 2023-12-10 | Discharge: 2023-12-10 | Disposition: A | Discharge status: Home / Self Care | Payer: 59 | Source: Ambulatory Visit | Attending: Physician Assistant | Admitting: Physician Assistant

## 2023-12-10 DIAGNOSIS — Z1231 Encounter for screening mammogram for malignant neoplasm of breast: Secondary | ICD-10-CM

## 2024-10-19 ENCOUNTER — Other Ambulatory Visit: Payer: Self-pay | Admitting: Physician Assistant

## 2024-10-19 DIAGNOSIS — Z1231 Encounter for screening mammogram for malignant neoplasm of breast: Secondary | ICD-10-CM

## 2024-12-10 ENCOUNTER — Ambulatory Visit
